# Patient Record
Sex: Female | Born: 1995 | Race: Black or African American | Hispanic: No | Marital: Single | Smoking: Never smoker
Health system: Southern US, Community
[De-identification: ages and names within clinical notes are randomized; demographics above are authoritative.]

## PROBLEM LIST (undated history)

## (undated) ENCOUNTER — Inpatient Hospital Stay (HOSPITAL_COMMUNITY): Payer: Self-pay

## (undated) DIAGNOSIS — F419 Anxiety disorder, unspecified: Secondary | ICD-10-CM

## (undated) DIAGNOSIS — D649 Anemia, unspecified: Secondary | ICD-10-CM

## (undated) DIAGNOSIS — D563 Thalassemia minor: Secondary | ICD-10-CM

## (undated) DIAGNOSIS — J45909 Unspecified asthma, uncomplicated: Secondary | ICD-10-CM

## (undated) DIAGNOSIS — F32A Depression, unspecified: Secondary | ICD-10-CM

## (undated) DIAGNOSIS — R519 Headache, unspecified: Secondary | ICD-10-CM

## (undated) HISTORY — DX: Anxiety disorder, unspecified: F41.9

## (undated) HISTORY — DX: Depression, unspecified: F32.A

## (undated) HISTORY — PX: NO PAST SURGERIES: SHX2092

## (undated) HISTORY — DX: Anemia, unspecified: D64.9

## (undated) HISTORY — DX: Headache, unspecified: R51.9

---
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
## 2016-01-11 ENCOUNTER — Inpatient Hospital Stay (HOSPITAL_COMMUNITY): Admission: AD | Admit: 2016-01-11 | Payer: Medicaid Other | Source: Ambulatory Visit | Admitting: Obstetrics

## 2016-03-12 ENCOUNTER — Other Ambulatory Visit (HOSPITAL_COMMUNITY): Payer: Self-pay | Admitting: Obstetrics

## 2016-03-12 DIAGNOSIS — Z3689 Encounter for other specified antenatal screening: Secondary | ICD-10-CM

## 2016-03-18 ENCOUNTER — Ambulatory Visit (HOSPITAL_COMMUNITY)
Admission: RE | Admit: 2016-03-18 | Discharge: 2016-03-18 | Disposition: A | Discharge status: Home / Self Care | Payer: Medicaid Other | Source: Ambulatory Visit | Attending: Obstetrics | Admitting: Obstetrics

## 2016-03-18 DIAGNOSIS — Z36 Encounter for antenatal screening of mother: Secondary | ICD-10-CM | POA: Insufficient documentation

## 2016-03-18 DIAGNOSIS — Z3A18 18 weeks gestation of pregnancy: Secondary | ICD-10-CM | POA: Insufficient documentation

## 2016-03-18 DIAGNOSIS — Z3689 Encounter for other specified antenatal screening: Secondary | ICD-10-CM

## 2016-03-24 ENCOUNTER — Emergency Department (HOSPITAL_COMMUNITY)
Admission: EM | Admit: 2016-03-24 | Discharge: 2016-03-24 | Disposition: A | Discharge status: Home / Self Care | Payer: Medicaid Other | Attending: Emergency Medicine | Admitting: Emergency Medicine

## 2016-03-24 ENCOUNTER — Encounter (HOSPITAL_COMMUNITY): Payer: Self-pay

## 2016-03-24 DIAGNOSIS — R232 Flushing: Secondary | ICD-10-CM

## 2016-03-24 DIAGNOSIS — J45909 Unspecified asthma, uncomplicated: Secondary | ICD-10-CM | POA: Insufficient documentation

## 2016-03-24 DIAGNOSIS — Z87891 Personal history of nicotine dependence: Secondary | ICD-10-CM | POA: Insufficient documentation

## 2016-03-24 DIAGNOSIS — R21 Rash and other nonspecific skin eruption: Secondary | ICD-10-CM

## 2016-03-24 LAB — URINALYSIS, ROUTINE W REFLEX MICROSCOPIC
BILIRUBIN URINE: NEGATIVE
Glucose, UA: NEGATIVE mg/dL
Hgb urine dipstick: NEGATIVE
Ketones, ur: NEGATIVE mg/dL
LEUKOCYTES UA: NEGATIVE
NITRITE: NEGATIVE
PH: 6.5 (ref 5.0–8.0)
Protein, ur: NEGATIVE mg/dL
SPECIFIC GRAVITY, URINE: 1.025 (ref 1.005–1.030)

## 2016-03-24 LAB — CBC WITH DIFFERENTIAL/PLATELET
BASOS ABS: 0 10*3/uL (ref 0.0–0.1)
BASOS PCT: 0 %
Eosinophils Absolute: 0 10*3/uL (ref 0.0–0.7)
Eosinophils Relative: 1 %
HEMATOCRIT: 33.3 % — AB (ref 36.0–46.0)
HEMOGLOBIN: 10.9 g/dL — AB (ref 12.0–15.0)
Lymphocytes Relative: 18 %
Lymphs Abs: 1 10*3/uL (ref 0.7–4.0)
MCH: 28.7 pg (ref 26.0–34.0)
MCHC: 32.7 g/dL (ref 30.0–36.0)
MCV: 87.6 fL (ref 78.0–100.0)
MONOS PCT: 4 %
Monocytes Absolute: 0.2 10*3/uL (ref 0.1–1.0)
NEUTROS ABS: 4.6 10*3/uL (ref 1.7–7.7)
NEUTROS PCT: 78 %
Platelets: 251 10*3/uL (ref 150–400)
RBC: 3.8 MIL/uL — ABNORMAL LOW (ref 3.87–5.11)
RDW: 12.8 % (ref 11.5–15.5)
WBC: 5.9 10*3/uL (ref 4.0–10.5)

## 2016-03-24 LAB — BASIC METABOLIC PANEL
ANION GAP: 8 (ref 5–15)
BUN: 7 mg/dL (ref 6–20)
CHLORIDE: 104 mmol/L (ref 101–111)
CO2: 21 mmol/L — AB (ref 22–32)
Calcium: 9.6 mg/dL (ref 8.9–10.3)
Creatinine, Ser: 0.58 mg/dL (ref 0.44–1.00)
GFR calc non Af Amer: >60 mL/min (ref >60)
Glucose, Bld: 83 mg/dL (ref 65–99)
POTASSIUM: 3.8 mmol/L (ref 3.5–5.1)
Sodium: 133 mmol/L — ABNORMAL LOW (ref 135–145)

## 2016-03-24 LAB — TSH: TSH: 1.509 u[IU]/mL (ref 0.350–4.500)

## 2016-03-24 NOTE — ED Provider Notes (Signed)
CSN: [id number]     Arrival date & time 03/24/16  1616 History   First MD Initiated Contact with Patient 03/24/16 1632     Chief Complaint  Patient presents with  . Rash  . feeling hot      (Consider location/radiation/quality/duration/timing/severity/associated sxs/prior Treatment) Patient is a 20 y.o. female presenting with rash. The history is provided by the patient.  Rash Location:  Head/neck, face and shoulder/arm Head/neck rash location:  L neck and R neck Facial rash location:  Face, forehead and L cheek Shoulder/arm rash location:  L upper arm Quality: dryness and itchiness   Severity:  Mild Onset quality:  Gradual Duration:  2 weeks Timing:  Constant Progression:  Worsening Chronicity:  Recurrent Context: pregnancy   Context: not animal contact, not chemical exposure, not exposure to similar rash and not sick contacts   Relieved by:  Nothing Ineffective treatments:  Moisturizers Associated symptoms: no abdominal pain, no diarrhea, no fever, no joint pain, no nausea, no shortness of breath, no sore throat, no throat swelling, not vomiting and not wheezing     Past Medical History  Diagnosis Date  . Asthma    Past Surgical History  Procedure Laterality Date  . No past surgeries     Family History  Problem Relation Age of Onset  . Cancer Neg Hx   . Diabetes Neg Hx   . Hypertension Neg Hx    Social History  Substance Use Topics  . Smoking status: Former Games developermoker  . Smokeless tobacco: Never Used  . Alcohol Use: No   OB History    Gravida Para Term Preterm AB TAB SAB Ectopic Multiple Living   1              Review of Systems  Constitutional: Negative for fever.  HENT: Negative for congestion, facial swelling and sore throat.   Respiratory: Negative for shortness of breath and wheezing.   Gastrointestinal: Negative for nausea, vomiting, abdominal pain and diarrhea.  Genitourinary: Negative for dysuria and difficulty urinating.  Musculoskeletal: Negative  for joint swelling and arthralgias.  Skin: Positive for rash.  Psychiatric/Behavioral: Negative for confusion and agitation.      Allergies  Review of patient's allergies indicates no known allergies.  Home Medications   Prior to Admission medications   Medication Sig Start Date End Date Taking? Authorizing Provider  acetaminophen (TYLENOL) 325 MG tablet Take 650 mg by mouth every 6 (six) hours as needed for mild pain.     Historical Provider, MD  PAZEO 0.7 % SOLN Place 1 drop into both eyes 3 (three) times daily. 12/18/15   Historical Provider, MD  Prenatal Vit-Fe Fumarate-FA (PRENATAL MULTIVITAMIN) TABS tablet Take 1 tablet by mouth daily at 12 noon.    Historical Provider, MD  valACYclovir (VALTREX) 1000 MG tablet Take 1,000 mg by mouth daily as needed (outbreak).  08/08/15   Historical Provider, MD   BP 111/71 mmHg  Pulse 81  Temp(Src) 98.2 F (36.8 C) (Oral)  Resp 18  SpO2 99%  LMP 11/09/2015 Physical Exam  Constitutional: She is oriented to person, place, and time. She appears well-developed and well-nourished. No distress.  HENT:  Head: Normocephalic.  Eyes: Conjunctivae are normal.  Neck: Normal range of motion.  Cardiovascular: Normal rate and normal heart sounds.  Exam reveals no gallop and no friction rub.   No murmur heard. Pulmonary/Chest: Effort normal and breath sounds normal.  Musculoskeletal: Normal range of motion. She exhibits no edema.  Neurological: She is alert and  oriented to person, place, and time.  Skin: She is not diaphoretic.  Hypopigmented macules 3-5 mm scattered over the R&L cheeks and forehead. Hyperpigmented patch on the LUE and neck. Dry. No scales or hyperkeratotic lesions.   Psychiatric: She has a normal mood and affect. Her behavior is normal.  Nursing note and vitals reviewed.   ED Course  Procedures (including critical care time) Labs Review Labs Reviewed  CBC WITH DIFFERENTIAL/PLATELET  BASIC METABOLIC PANEL  URINALYSIS, ROUTINE  W REFLEX MICROSCOPIC (NOT AT [facility])    Imaging Review No results found. I have personally reviewed and evaluated these images and lab results as part of my medical decision-making.   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 She is [redacted] weeks pregnant. The lesions on the face appear as small hypopigmented macules over the cheeks and forehead while the neck and arm lesions are hyperpigmented, slightly raised, and 4-5 cm in length. Possible dry skin with local inflammatory reaction from scratching. The patient is otherwise well appearing with normal vitals.  TSH 1.5. CBC, BMP within normal limits. Urinalysis with no evidence of infection.   Patient requests desonide which she states has worked before but is Class C in pregnancy. Patient referred to her dermatologist for further evaluation. Hot flashes likely hormonal 2/2 pregnancy.   Patient discharged in good condition. Seen with Dr. Rhunette CroftNanavati who agreed with this plan.     Levora AngelEric Marcea Rojek, MD 03/25/16 [id number]  Derwood KaplanAnkit Nanavati, MD 03/31/16 [phone]

## 2016-03-24 NOTE — ED Notes (Addendum)
Patient complains of "feeling hot" x 2 months. States that she has also developed rash to face from sweating, no distress. Minimal rash noted to cheeks.patient has seen OB doctor for same complaint and he suggested she needs to hydrate more, states she drinks lots of water and feeling no better. Complains of some leg cramps with same. Also taking her iron as directed

## 2016-04-04 ENCOUNTER — Ambulatory Visit (INDEPENDENT_AMBULATORY_CARE_PROVIDER_SITE_OTHER): Payer: Medicaid Other | Admitting: Obstetrics & Gynecology

## 2016-04-04 VITALS — BP 107/62 | HR 80 | Wt 161.0 lb

## 2016-04-04 DIAGNOSIS — Z3492 Encounter for supervision of normal pregnancy, unspecified, second trimester: Secondary | ICD-10-CM

## 2016-04-04 DIAGNOSIS — Z3490 Encounter for supervision of normal pregnancy, unspecified, unspecified trimester: Secondary | ICD-10-CM

## 2016-04-04 DIAGNOSIS — IMO0002 Reserved for concepts with insufficient information to code with codable children: Secondary | ICD-10-CM

## 2016-04-04 DIAGNOSIS — Z0489 Encounter for examination and observation for other specified reasons: Secondary | ICD-10-CM

## 2016-04-04 DIAGNOSIS — Z6791 Unspecified blood type, Rh negative: Secondary | ICD-10-CM | POA: Insufficient documentation

## 2016-04-04 DIAGNOSIS — O26899 Other specified pregnancy related conditions, unspecified trimester: Secondary | ICD-10-CM

## 2016-04-04 NOTE — Patient Instructions (Signed)
Return to clinic for any scheduled appointments or obstetric concerns, or go to [facility] for evaluation  Second Trimester of Pregnancy The second trimester is from week 13 through week 28, months 4 through 6. The second trimester is often a time when you feel your best. Your body has also adjusted to being pregnant, and you begin to feel better physically. Usually, morning sickness has lessened or quit completely, you may have more energy, and you may have an increase in appetite. The second trimester is also a time when the fetus is growing rapidly. At the end of the sixth month, the fetus is about 9 inches long and weighs about 1 pounds. You will likely begin to feel the baby move (quickening) between 18 and 20 weeks of the pregnancy. BODY CHANGES Your body goes through many changes during pregnancy. The changes vary from woman to woman.   Your weight will continue to increase. You will notice your lower abdomen bulging out.  You may begin to get stretch marks on your hips, abdomen, and breasts.  You may develop headaches that can be relieved by medicines approved by your health care provider.  You may urinate more often because the fetus is pressing on your bladder.  You may develop or continue to have heartburn as a result of your pregnancy.  You may develop constipation because certain hormones are causing the muscles that push waste through your intestines to slow down.  You may develop hemorrhoids or swollen, bulging veins (varicose veins).  You may have back pain because of the weight gain and pregnancy hormones relaxing your joints between the bones in your pelvis and as a result of a shift in weight and the muscles that support your balance.  Your breasts will continue to grow and be tender.  Your gums may bleed and may be sensitive to brushing and flossing.  Dark spots or blotches (chloasma, mask of pregnancy) may develop on your face. This will likely fade after the baby is  born.  A dark line from your belly button to the pubic area (linea nigra) may appear. This will likely fade after the baby is born.  You may have changes in your hair. These can include thickening of your hair, rapid growth, and changes in texture. Some women also have hair loss during or after pregnancy, or hair that feels dry or thin. Your hair will most likely return to normal after your baby is born. WHAT TO EXPECT AT YOUR PRENATAL VISITS During a routine prenatal visit:  You will be weighed to make sure you and the fetus are growing normally.  Your blood pressure will be taken.  Your abdomen will be measured to track your baby's growth.  The fetal heartbeat will be listened to.  Any test results from the previous visit will be discussed. Your health care provider may ask you:  How you are feeling.  If you are feeling the baby move.  If you have had any abnormal symptoms, such as leaking fluid, bleeding, severe headaches, or abdominal cramping.  If you are using any tobacco products, including cigarettes, chewing tobacco, and electronic cigarettes.  If you have any questions. Other tests that may be performed during your second trimester include:  Blood tests that check for:  Low iron levels (anemia).  Gestational diabetes (between 24 and 28 weeks).  Rh antibodies.  Urine tests to check for infections, diabetes, or protein in the urine.  An ultrasound to confirm the proper growth and development of   the baby.  An amniocentesis to check for possible genetic problems.  Fetal screens for spina bifida and Down syndrome.  HIV (human immunodeficiency virus) testing. Routine prenatal testing includes screening for HIV, unless you choose not to have this test. HOME CARE INSTRUCTIONS   Avoid all smoking, herbs, alcohol, and unprescribed drugs. These chemicals affect the formation and growth of the baby.  Do not use any tobacco products, including cigarettes, chewing  tobacco, and electronic cigarettes. If you need help quitting, ask your health care provider. You may receive counseling support and other resources to help you quit.  Follow your health care provider's instructions regarding medicine use. There are medicines that are either safe or unsafe to take during pregnancy.  Exercise only as directed by your health care provider. Experiencing uterine cramps is a good sign to stop exercising.  Continue to eat regular, healthy meals.  Wear a good support bra for breast tenderness.  Do not use hot tubs, steam rooms, or saunas.  Wear your seat belt at all times when driving.  Avoid raw meat, uncooked cheese, cat litter boxes, and soil used by cats. These carry germs that can cause birth defects in the baby.  Take your prenatal vitamins.  Take 1500-2000 mg of calcium daily starting at the 20th week of pregnancy until you deliver your baby.  Try taking a stool softener (if your health care provider approves) if you develop constipation. Eat more high-fiber foods, such as fresh vegetables or fruit and whole grains. Drink plenty of fluids to keep your urine clear or pale yellow.  Take warm sitz baths to soothe any pain or discomfort caused by hemorrhoids. Use hemorrhoid cream if your health care provider approves.  If you develop varicose veins, wear support hose. Elevate your feet for 15 minutes, 3-4 times a day. Limit salt in your diet.  Avoid heavy lifting, wear low heel shoes, and practice good posture.  Rest with your legs elevated if you have leg cramps or low back pain.  Visit your dentist if you have not gone yet during your pregnancy. Use a soft toothbrush to brush your teeth and be gentle when you floss.  A sexual relationship may be continued unless your health care provider directs you otherwise.  Continue to go to all your prenatal visits as directed by your health care provider. SEEK MEDICAL CARE IF:   You have dizziness.  You  have mild pelvic cramps, pelvic pressure, or nagging pain in the abdominal area.  You have persistent nausea, vomiting, or diarrhea.  You have a bad smelling vaginal discharge.  You have pain with urination. SEEK IMMEDIATE MEDICAL CARE IF:   You have a fever.  You are leaking fluid from your vagina.  You have spotting or bleeding from your vagina.  You have severe abdominal cramping or pain.  You have rapid weight gain or loss.  You have shortness of breath with chest pain.  You notice sudden or extreme swelling of your face, hands, ankles, feet, or legs.  You have not felt your baby move in over an hour.  You have severe headaches that do not go away with medicine.  You have vision changes.   This information is not intended to replace advice given to you by your health care provider. Make sure you discuss any questions you have with your health care provider.   Document Released: 09/03/2001 Document Revised: 09/30/2014 Document Reviewed: 11/10/2012 Elsevier Interactive Patient Education 2016 [employer]  

## 2016-04-04 NOTE — Progress Notes (Signed)
Subjective:  Christina HellerSamantha Willis is a 20 y.o. G1P0 at 7337w0d being seen today for ongoing prenatal care.  She is currently monitored for the following issues for this low-risk pregnancy and has Supervision of normal pregnancy and Rh negative, antepartum on her problem list.  Patient reports no complaints.  Contractions: Not present. Vag. Bleeding: None.  Movement: Present. Denies leaking of fluid.   The following portions of the patient's history were reviewed and updated as appropriate: allergies, current medications, past family history, past medical history, past social history, past surgical history and problem list. Problem list updated.  Objective:   Filed Vitals:   04/04/16 1313  BP: 107/62  Pulse: 80  Weight: 161 lb (73.029 kg)    Fetal Status: Fetal Heart Rate (bpm): 143 Fundal Height: 21 cm Movement: Present     General:  Alert, oriented and cooperative. Patient is in no acute distress.  Skin: Skin is warm and dry. No rash noted.   Cardiovascular: Normal heart rate noted  Respiratory: Normal respiratory effort, no problems with respiration noted  Abdomen: Soft, gravid, appropriate for gestational age. Pain/Pressure: Absent     Pelvic:  Cervical exam deferred        Extremities: Normal range of motion.  Edema: None  Mental Status: Normal mood and affect. Normal behavior. Normal judgment and thought content.   Urinalysis: Urine Protein: Negative Urine Glucose: Negative  Assessment and Plan:  Pregnancy: G1P0 at 3737w0d  1. Evaluate anatomy not seen on prior sonogram Inadequate anatomy scan at 18 weeks, rescan ordered - US MFM OB FOLLOW UP; Future  2. Supervision of normal pregnancy, second trimester - AFP, Quad Screen Preterm labor symptoms and general obstetric precautions including but not limited to vaginal bleeding, contractions, leaking of fluid and fetal movement were reviewed in detail with the patient. Please refer to After Visit Summary for other counseling  recommendations.  Return in about 4 weeks (around 05/02/2016) for OB Visit.   Tereso NewcomerUgonna A Kindred Reidinger, MD

## 2016-04-09 LAB — AFP, QUAD SCREEN
DIA Mom Value: 0.67
DIA VALUE (EIA): 136.48 pg/mL
DSR (BY AGE) 1 IN: 1167
DSR (Second Trimester) 1 IN: 7486
Gestational Age: 21 WEEKS
MATERNAL AGE AT EDD: 19.7 a
MSAFP MOM: 0.55
MSAFP: 37.3 ng/mL
MSHCG Mom: 0.98
MSHCG: 20628 m[IU]/mL
Osb Risk: 10000
PDF: 0
T18 (By Age): 1:4548 {titer}
TEST RESULTS AFP: NEGATIVE
UE3 MOM: 0.97
UE3 VALUE: 2.04 ng/mL
WEIGHT: 161 [lb_av]

## 2016-04-11 ENCOUNTER — Encounter: Payer: Self-pay | Admitting: *Deleted

## 2016-04-17 ENCOUNTER — Ambulatory Visit (HOSPITAL_COMMUNITY)
Admission: RE | Admit: 2016-04-17 | Discharge: 2016-04-17 | Disposition: A | Discharge status: Home / Self Care | Payer: Medicaid Other | Source: Ambulatory Visit | Attending: Obstetrics & Gynecology | Admitting: Obstetrics & Gynecology

## 2016-04-17 DIAGNOSIS — IMO0002 Reserved for concepts with insufficient information to code with codable children: Secondary | ICD-10-CM

## 2016-04-17 DIAGNOSIS — Z36 Encounter for antenatal screening of mother: Secondary | ICD-10-CM | POA: Insufficient documentation

## 2016-04-17 DIAGNOSIS — Z0489 Encounter for examination and observation for other specified reasons: Secondary | ICD-10-CM

## 2016-04-17 DIAGNOSIS — Z3A22 22 weeks gestation of pregnancy: Secondary | ICD-10-CM | POA: Diagnosis not present

## 2016-04-30 ENCOUNTER — Ambulatory Visit (INDEPENDENT_AMBULATORY_CARE_PROVIDER_SITE_OTHER): Payer: Medicaid Other | Admitting: Obstetrics & Gynecology

## 2016-04-30 VITALS — BP 124/79 | HR 89 | Wt 175.0 lb

## 2016-04-30 DIAGNOSIS — Z3402 Encounter for supervision of normal first pregnancy, second trimester: Secondary | ICD-10-CM | POA: Diagnosis not present

## 2016-04-30 DIAGNOSIS — Z331 Pregnant state, incidental: Secondary | ICD-10-CM

## 2016-04-30 DIAGNOSIS — Z1389 Encounter for screening for other disorder: Secondary | ICD-10-CM

## 2016-04-30 LAB — POCT URINALYSIS DIPSTICK
Bilirubin, UA: NEGATIVE
Blood, UA: NEGATIVE
KETONES UA: NEGATIVE
NITRITE UA: NEGATIVE
PROTEIN UA: NEGATIVE
Spec Grav, UA: 1.01
Urobilinogen, UA: 0.2
pH, UA: 6

## 2016-04-30 NOTE — Progress Notes (Signed)
Subjective:  Christina HellerSamantha Willis is a 20 y.o. G1P0 at 2331w5d being seen today for ongoing prenatal care.  She is currently monitored for the following issues for this low-risk pregnancy and has Supervision of normal pregnancy and Rh negative, antepartum on her problem list.  Patient reports no complaints.  Contractions: Not present. Vag. Bleeding: None.  Movement: Present. Denies leaking of fluid.   The following portions of the patient's history were reviewed and updated as appropriate: allergies, current medications, past family history, past medical history, past social history, past surgical history and problem list. Problem list updated.  Objective:   Vitals:   04/30/16 1335  BP: 124/79  Pulse: 89  Weight: 175 lb (79.4 kg)    Fetal Status: Fetal Heart Rate (bpm): 138 Fundal Height: 24 cm Movement: Present     General:  Alert, oriented and cooperative. Patient is in no acute distress.  Skin: Skin is warm and dry. No rash noted.   Cardiovascular: Normal heart rate noted  Respiratory: Normal respiratory effort, no problems with respiration noted  Abdomen: Soft, gravid, appropriate for gestational age. Pain/Pressure: Absent     Pelvic:  Cervical exam deferred        Extremities: Normal range of motion.  Edema: None  Mental Status: Normal mood and affect. Normal behavior. Normal judgment and thought content.   Urinalysis: Urine Protein: Negative Urine Glucose: Negative  Assessment and Plan:  Pregnancy: G1P0 at 9831w5d  1. Encounter for supervision of normal first pregnancy in second trimester Preterm labor symptoms and general obstetric precautions including but not limited to vaginal bleeding, contractions, leaking of fluid and fetal movement were reviewed in detail with the patient. Please refer to After Visit Summary for other counseling recommendations.  Return in about 4 weeks (around 05/28/2016) for 2 hr GTT, 3rd trimester labs, TDap, OB Visit (schedule in morning, needs to be  fasting).   Tereso NewcomerUgonna A Zoha Spranger, MD

## 2016-04-30 NOTE — Patient Instructions (Signed)
Return to clinic for any scheduled appointments or obstetric concerns, or go to [facility] for evaluation  Tdap Vaccine (Tetanus, Diphtheria and Pertussis): What You Need to Know 1. Why get vaccinated? Tetanus, diphtheria and pertussis are very serious diseases. Tdap vaccine can protect us from these diseases. And, Tdap vaccine given to pregnant women can protect newborn babies against pertussis. TETANUS (Lockjaw) is rare in the [place] today. It causes painful muscle tightening and stiffness, usually all over the body.  It can lead to tightening of muscles in the head and neck so you can't open your mouth, swallow, or sometimes even breathe. Tetanus kills about 1 out of 10 people who are infected even after receiving the best medical care. DIPHTHERIA is also rare in the [place] today. It can cause a thick coating to form in the back of the throat.  It can lead to breathing problems, heart failure, paralysis, and death. PERTUSSIS (Whooping Cough) causes severe coughing spells, which can cause difficulty breathing, vomiting and disturbed sleep.  It can also lead to weight loss, incontinence, and rib fractures. Up to 2 in 100 adolescents and 5 in 100 adults with pertussis are hospitalized or have complications, which could include pneumonia or death. These diseases are caused by bacteria. Diphtheria and pertussis are spread from person to person through secretions from coughing or sneezing. Tetanus enters the body through cuts, scratches, or wounds. Before vaccines, as many as 200,000 cases of diphtheria, 200,000 cases of pertussis, and hundreds of cases of tetanus, were reported in the [place] each year. Since vaccination began, reports of cases for tetanus and diphtheria have dropped by about 99% and for pertussis by about 80%. 2. Tdap vaccine Tdap vaccine can protect adolescents and adults from tetanus, diphtheria, and pertussis. One dose of Tdap is routinely given at age 11 or 12.  People who did not get Tdap at that age should get it as soon as possible. Tdap is especially important for healthcare professionals and anyone having close contact with a baby younger than 12 months. Pregnant women should get a dose of Tdap during every pregnancy, to protect the newborn from pertussis. Infants are most at risk for severe, life-threatening complications from pertussis. Another vaccine, called Td, protects against tetanus and diphtheria, but not pertussis. A Td booster should be given every 10 years. Tdap may be given as one of these boosters if you have never gotten Tdap before. Tdap may also be given after a severe cut or burn to prevent tetanus infection. Your doctor or the person giving you the vaccine can give you more information. Tdap may safely be given at the same time as other vaccines. 3. Some people should not get this vaccine  A person who has ever had a life-threatening allergic reaction after a previous dose of any diphtheria, tetanus or pertussis containing vaccine, OR has a severe allergy to any part of this vaccine, should not get Tdap vaccine. Tell the person giving the vaccine about any severe allergies.  Anyone who had coma or long repeated seizures within 7 days after a childhood dose of DTP or DTaP, or a previous dose of Tdap, should not get Tdap, unless a cause other than the vaccine was found. They can still get Td.  Talk to your doctor if you:  have seizures or another nervous system problem,  had severe pain or swelling after any vaccine containing diphtheria, tetanus or pertussis,  ever had a condition called Guillain-Barr Syndrome (GBS),  aren't feeling   well on the day the shot is scheduled. 4. Risks With any medicine, including vaccines, there is a chance of side effects. These are usually mild and go away on their own. Serious reactions are also possible but are rare. Most people who get Tdap vaccine do not have any problems with it. Mild  problems following Tdap (Did not interfere with activities)  Pain where the shot was given (about 3 in 4 adolescents or 2 in 3 adults)  Redness or swelling where the shot was given (about 1 person in 5)  Mild fever of at least 100.4F (up to about 1 in 25 adolescents or 1 in 100 adults)  Headache (about 3 or 4 people in 10)  Tiredness (about 1 person in 3 or 4)  Nausea, vomiting, diarrhea, stomach ache (up to 1 in 4 adolescents or 1 in 10 adults)  Chills, sore joints (about 1 person in 10)  Body aches (about 1 person in 3 or 4)  Rash, swollen glands (uncommon) Moderate problems following Tdap (Interfered with activities, but did not require medical attention)  Pain where the shot was given (up to 1 in 5 or 6)  Redness or swelling where the shot was given (up to about 1 in 16 adolescents or 1 in 12 adults)  Fever over 102F (about 1 in 100 adolescents or 1 in 250 adults)  Headache (about 1 in 7 adolescents or 1 in 10 adults)  Nausea, vomiting, diarrhea, stomach ache (up to 1 or 3 people in 100)  Swelling of the entire arm where the shot was given (up to about 1 in 500). Severe problems following Tdap (Unable to perform usual activities; required medical attention)  Swelling, severe pain, bleeding and redness in the arm where the shot was given (rare). Problems that could happen after any vaccine:  People sometimes faint after a medical procedure, including vaccination. Sitting or lying down for about 15 minutes can help prevent fainting, and injuries caused by a fall. Tell your doctor if you feel dizzy, or have vision changes or ringing in the ears.  Some people get severe pain in the shoulder and have difficulty moving the arm where a shot was given. This happens very rarely.  Any medication can cause a severe allergic reaction. Such reactions from a vaccine are very rare, estimated at fewer than 1 in a million doses, and would happen within a few minutes to a few hours  after the vaccination. As with any medicine, there is a very remote chance of a vaccine causing a serious injury or death. The safety of vaccines is always being monitored. For more information, visit: www.cdc.gov/vaccinesafety/ 5. What if there is a serious problem? What should I look for?  Look for anything that concerns you, such as signs of a severe allergic reaction, very high fever, or unusual behavior.  Signs of a severe allergic reaction can include hives, swelling of the face and throat, difficulty breathing, a fast heartbeat, dizziness, and weakness. These would usually start a few minutes to a few hours after the vaccination. What should I do?  If you think it is a severe allergic reaction or other emergency that can't wait, call 9-1-1 or get the person to the nearest hospital. Otherwise, call your doctor.  Afterward, the reaction should be reported to the Vaccine Adverse Event Reporting System (VAERS). Your doctor might file this report, or you can do it yourself through the VAERS web site at www.vaers.hhs.gov, or by calling 1-800-822-7967. VAERS does not   give medical advice.  6. The National Vaccine Injury Compensation Program The National Vaccine Injury Compensation Program (VICP) is a federal program that was created to compensate people who may have been injured by certain vaccines. Persons who believe they may have been injured by a vaccine can learn about the program and about filing a claim by calling 1-800-338-2382 or visiting the VICP website at www.hrsa.gov/vaccinecompensation. There is a time limit to file a claim for compensation. 7. How can I learn more?  Ask your doctor. He or she can give you the vaccine package insert or suggest other sources of information.  Call your local or state health department.  Contact the Centers for Disease Control and Prevention (CDC):  Call 1-800-232-4636 (1-800-CDC-INFO) or  Visit CDC's website at www.cdc.gov/vaccines CDC Tdap  Vaccine VIS (11/16/13)   This information is not intended to replace advice given to you by your health care provider. Make sure you discuss any questions you have with your health care provider.   Document Released: 03/10/2012 Document Revised: 09/30/2014 Document Reviewed: 12/22/2013 Elsevier Interactive Patient Education 2016 Elsevier Inc.  

## 2016-05-14 ENCOUNTER — Telehealth: Payer: Self-pay | Admitting: *Deleted

## 2016-05-14 ENCOUNTER — Other Ambulatory Visit: Payer: Self-pay | Admitting: Obstetrics and Gynecology

## 2016-05-14 MED ORDER — PRENATAL VITAMINS 0.8 MG PO TABS: 1.0000 | ORAL_TABLET | Freq: Every day | ORAL | 12 refills | Status: DC

## 2016-05-14 NOTE — Telephone Encounter (Signed)
Patient states her provider forgot her prescriptions. She wants a pill form instead of chewable for her prenatal.  She also is requesting a refill of the Tylenol #3 that her previous provider had given her for pain. [facility] is [facility] the [facility] location. I told her the prenatal vitamins would not be a problem, but her provider would have to decide about refilling her pain medication.

## 2016-05-21 ENCOUNTER — Telehealth: Payer: Self-pay | Admitting: *Deleted

## 2016-05-21 NOTE — Telephone Encounter (Signed)
Patient notified of Rx at pharmacy and told she would be evaluated at her next appointment.

## 2016-05-21 NOTE — Telephone Encounter (Signed)
-----   Message from Catalina AntiguaPeggy Constant, MD sent at 05/14/2016  3:24 PM EDT ----- Good afternoon,  Rx prenatal vitamin tablet e-prescribed. No refill on pain prescription provided. She will have to be seen and evaluated at her next appointment  Thanks  Peggy

## 2016-05-28 ENCOUNTER — Ambulatory Visit (INDEPENDENT_AMBULATORY_CARE_PROVIDER_SITE_OTHER): Payer: Medicaid Other | Admitting: Obstetrics & Gynecology

## 2016-05-28 ENCOUNTER — Other Ambulatory Visit: Payer: Medicaid Other

## 2016-05-28 VITALS — BP 109/70 | HR 94 | Temp 98.1°F | Wt 173.8 lb

## 2016-05-28 DIAGNOSIS — Z3493 Encounter for supervision of normal pregnancy, unspecified, third trimester: Secondary | ICD-10-CM

## 2016-05-28 DIAGNOSIS — O360130 Maternal care for anti-D [Rh] antibodies, third trimester, not applicable or unspecified: Secondary | ICD-10-CM

## 2016-05-28 DIAGNOSIS — Z23 Encounter for immunization: Secondary | ICD-10-CM

## 2016-05-28 MED ORDER — RHO D IMMUNE GLOBULIN 1500 UNITS IM SOSY
1500.0000 [IU] | PREFILLED_SYRINGE | Freq: Once | INTRAMUSCULAR | Status: AC
  Administered 2016-05-28: 1500 [IU] via INTRAMUSCULAR

## 2016-05-28 NOTE — Progress Notes (Signed)
   PRENATAL VISIT NOTE  Subjective:  Christina Willis is a 20 y.o. G1P0 at 323w5d being seen today for ongoing prenatal care.  She is currently monitored for the following issues for this low-risk pregnancy and has Supervision of normal pregnancy and Rh negative, antepartum on her problem list.  Patient reports no complaints.  Contractions: Not present. Vag. Bleeding: None.  Movement: Present. Denies leaking of fluid.   The following portions of the patient's history were reviewed and updated as appropriate: allergies, current medications, past family history, past medical history, past social history, past surgical history and problem list. Problem list updated.  Objective:   Vitals:   05/28/16 0948  BP: 109/70  Pulse: 94  Temp: 98.1 F (36.7 C)  Weight: 173 lb 12.8 oz (78.8 kg)    Fetal Status: Fetal Heart Rate (bpm): 140 Fundal Height: 28 cm Movement: Present     General:  Alert, oriented and cooperative. Patient is in no acute distress.  Skin: Skin is warm and dry. No rash noted.   Cardiovascular: Normal heart rate noted  Respiratory: Normal respiratory effort, no problems with respiration noted  Abdomen: Soft, gravid, appropriate for gestational age. Pain/Pressure: Absent     Pelvic:  Cervical exam deferred        Extremities: Normal range of motion.  Edema: None  Mental Status: Normal mood and affect. Normal behavior. Normal judgment and thought content.   Urinalysis: Urine Protein: Negative Urine Glucose: Negative  Assessment and Plan:  Pregnancy: G1P0 at 473w5d  1. Rh negative, antepartum, third trimester, not applicable or unspecified fetus Rhogam today - Rho D Immune Globulin SOSY 1,500 Units; Inject 1,500 Units into the muscle once.  2. Supervision of normal pregnancy, third trimester Third trimester labs and Tdap today - CBC - HIV antibody - RPR - Glucose Tolerance, 2 Hours w/1 Hour - Tdap vaccine Preterm labor symptoms and general obstetric precautions  including but not limited to vaginal bleeding, contractions, leaking of fluid and fetal movement were reviewed in detail with the patient. Please refer to After Visit Summary for other counseling recommendations.  Return in about 2 weeks (around 06/11/2016) for OB Visit.  Tereso NewcomerUgonna A Arick Mareno, MD

## 2016-05-28 NOTE — Patient Instructions (Signed)

## 2016-05-29 LAB — GLUCOSE TOLERANCE, 2 HOURS W/ 1HR
GLUCOSE, 1 HOUR: 130 mg/dL (ref 65–179)
GLUCOSE, FASTING: 79 mg/dL (ref 65–91)

## 2016-05-30 LAB — HIV ANTIBODY (ROUTINE TESTING W REFLEX): HIV Screen 4th Generation wRfx: NONREACTIVE

## 2016-05-30 LAB — CBC
HEMATOCRIT: 34.1 % (ref 34.0–46.6)
HEMOGLOBIN: 10.9 g/dL — AB (ref 11.1–15.9)
MCH: 28 pg (ref 26.6–33.0)
MCHC: 32 g/dL (ref 31.5–35.7)
MCV: 88 fL (ref 79–97)
PLATELETS: 255 10*3/uL (ref 150–379)
RBC: 3.89 x10E6/uL (ref 3.77–5.28)
RDW: 14.5 % (ref 12.3–15.4)
WBC: 7.2 10*3/uL (ref 3.4–10.8)

## 2016-05-30 LAB — RPR: RPR Ser Ql: NONREACTIVE

## 2016-06-12 ENCOUNTER — Ambulatory Visit (INDEPENDENT_AMBULATORY_CARE_PROVIDER_SITE_OTHER): Payer: Medicaid Other | Admitting: Obstetrics & Gynecology

## 2016-06-12 VITALS — BP 109/74 | HR 110 | Temp 97.8°F | Wt 178.8 lb

## 2016-06-12 DIAGNOSIS — Z3493 Encounter for supervision of normal pregnancy, unspecified, third trimester: Secondary | ICD-10-CM

## 2016-06-12 NOTE — Progress Notes (Signed)
   PRENATAL VISIT NOTE  Subjective:  Christina Willis is a 20 y.o. G1P0 at 1252w6d being seen today for ongoing prenatal care.  She is currently monitored for the following issues for this low-risk pregnancy and has Supervision of normal pregnancy and Rh negative, antepartum on her problem list.  Patient reports no complaints.  Contractions: Not present. Vag. Bleeding: None.  Movement: Present. Denies leaking of fluid.   The following portions of the patient's history were reviewed and updated as appropriate: allergies, current medications, past family history, past medical history, past social history, past surgical history and problem list. Problem list updated.  Objective:   Vitals:   06/12/16 1417  BP: 109/74  Pulse: (!) 110  Temp: 97.8 F (36.6 C)  Weight: 178 lb 12.8 oz (81.1 kg)    Fetal Status: Fetal Heart Rate (bpm): 155 Fundal Height: 30 cm Movement: Present     General:  Alert, oriented and cooperative. Patient is in no acute distress.  Skin: Skin is warm and dry. No rash noted.   Cardiovascular: Normal heart rate noted  Respiratory: Normal respiratory effort, no problems with respiration noted  Abdomen: Soft, gravid, appropriate for gestational age. Pain/Pressure: Present     Pelvic:  Cervical exam deferred        Extremities: Normal range of motion.  Edema: None  Mental Status: Normal mood and affect. Normal behavior. Normal judgment and thought content.   Urinalysis: Urine Protein: Negative Urine Glucose: Negative  Assessment and Plan:  Pregnancy: G1P0 at 2252w6d  1. Supervision of normal pregnancy, third trimester Preterm labor symptoms and general obstetric precautions including but not limited to vaginal bleeding, contractions, leaking of fluid and fetal movement were reviewed in detail with the patient. Please refer to After Visit Summary for other counseling recommendations.  Return in about 2 weeks (around 06/26/2016) for OB Visit.  Tereso NewcomerUgonna A Crestina Strike, MD

## 2016-06-12 NOTE — Patient Instructions (Signed)
Return to clinic for any scheduled appointments or obstetric concerns, or go to [facility] for evaluation  

## 2016-06-22 ENCOUNTER — Emergency Department (HOSPITAL_COMMUNITY)
Admission: EM | Admit: 2016-06-22 | Discharge: 2016-06-22 | Disposition: A | Discharge status: Home / Self Care | Payer: Medicaid Other | Attending: Emergency Medicine | Admitting: Emergency Medicine

## 2016-06-22 ENCOUNTER — Encounter (HOSPITAL_COMMUNITY): Payer: Self-pay | Admitting: Emergency Medicine

## 2016-06-22 DIAGNOSIS — Z3A32 32 weeks gestation of pregnancy: Secondary | ICD-10-CM | POA: Diagnosis not present

## 2016-06-22 DIAGNOSIS — Z87891 Personal history of nicotine dependence: Secondary | ICD-10-CM | POA: Insufficient documentation

## 2016-06-22 DIAGNOSIS — O2343 Unspecified infection of urinary tract in pregnancy, third trimester: Secondary | ICD-10-CM | POA: Insufficient documentation

## 2016-06-22 DIAGNOSIS — J45909 Unspecified asthma, uncomplicated: Secondary | ICD-10-CM | POA: Insufficient documentation

## 2016-06-22 DIAGNOSIS — O26893 Other specified pregnancy related conditions, third trimester: Secondary | ICD-10-CM | POA: Diagnosis present

## 2016-06-22 DIAGNOSIS — N390 Urinary tract infection, site not specified: Secondary | ICD-10-CM

## 2016-06-22 DIAGNOSIS — N949 Unspecified condition associated with female genital organs and menstrual cycle: Secondary | ICD-10-CM

## 2016-06-22 LAB — CBC WITH DIFFERENTIAL/PLATELET
BASOS ABS: 0 10*3/uL (ref 0.0–0.1)
Basophils Relative: 0 %
Eosinophils Absolute: 0.1 10*3/uL (ref 0.0–0.7)
Eosinophils Relative: 1 %
HEMATOCRIT: 32.3 % — AB (ref 36.0–46.0)
HEMOGLOBIN: 9.8 g/dL — AB (ref 12.0–15.0)
LYMPHS PCT: 22 %
Lymphs Abs: 1.6 10*3/uL (ref 0.7–4.0)
MCH: 27.2 pg (ref 26.0–34.0)
MCHC: 30.3 g/dL (ref 30.0–36.0)
MCV: 89.7 fL (ref 78.0–100.0)
Monocytes Absolute: 0.4 10*3/uL (ref 0.1–1.0)
Monocytes Relative: 5 %
NEUTROS ABS: 5.2 10*3/uL (ref 1.7–7.7)
NEUTROS PCT: 72 %
Platelets: 196 10*3/uL (ref 150–400)
RBC: 3.6 MIL/uL — AB (ref 3.87–5.11)
RDW: 13.9 % (ref 11.5–15.5)
WBC: 7.2 10*3/uL (ref 4.0–10.5)

## 2016-06-22 LAB — URINE MICROSCOPIC-ADD ON: RBC / HPF: NONE SEEN RBC/hpf (ref 0–5)

## 2016-06-22 LAB — URINALYSIS, ROUTINE W REFLEX MICROSCOPIC
Bilirubin Urine: NEGATIVE
Glucose, UA: NEGATIVE mg/dL
Hgb urine dipstick: NEGATIVE
KETONES UR: NEGATIVE mg/dL
NITRITE: NEGATIVE
Protein, ur: NEGATIVE mg/dL
Specific Gravity, Urine: 1.019 (ref 1.005–1.030)
pH: 7 (ref 5.0–8.0)

## 2016-06-22 LAB — BASIC METABOLIC PANEL
ANION GAP: 8 (ref 5–15)
BUN: <5 mg/dL — ABNORMAL LOW (ref 6–20)
CALCIUM: 8.2 mg/dL — AB (ref 8.9–10.3)
CHLORIDE: 103 mmol/L (ref 101–111)
CO2: 20 mmol/L — AB (ref 22–32)
Creatinine, Ser: 0.56 mg/dL (ref 0.44–1.00)
GFR calc non Af Amer: >60 mL/min (ref >60)
GLUCOSE: 74 mg/dL (ref 65–99)
POTASSIUM: 3.7 mmol/L (ref 3.5–5.1)
Sodium: 131 mmol/L — ABNORMAL LOW (ref 135–145)

## 2016-06-22 MED ORDER — CEPHALEXIN 500 MG PO CAPS: 500.0000 mg | ORAL_CAPSULE | Freq: Two times a day (BID) | ORAL | 0 refills | Status: AC

## 2016-06-22 MED ORDER — CEPHALEXIN 250 MG PO CAPS
500.0000 mg | ORAL_CAPSULE | Freq: Once | ORAL | Status: AC
  Administered 2016-06-22: 500 mg via ORAL
  Filled 2016-06-22: qty 2

## 2016-06-22 MED ORDER — TERBUTALINE SULFATE 1 MG/ML IJ SOLN
0.2500 mg | Freq: Once | INTRAMUSCULAR | Status: AC
  Administered 2016-06-22: 0.25 mg via SUBCUTANEOUS

## 2016-06-22 MED ORDER — LACTATED RINGERS IV BOLUS (SEPSIS)
1000.0000 mL | Freq: Once | INTRAVENOUS | Status: AC
  Administered 2016-06-22: 1000 mL via INTRAVENOUS

## 2016-06-22 MED ORDER — SODIUM CHLORIDE 0.9 % IV BOLUS (SEPSIS): 1000.0000 mL | Freq: Once | INTRAVENOUS | Status: DC

## 2016-06-22 NOTE — ED Provider Notes (Signed)
[facility]-EMERGENCY DEPT Provider Note   CSN: [id number] Arrival date & time: 06/22/16  1400     History   Chief Complaint Chief Complaint  Patient presents with  . pregnant, abdominal pain    HPI Christina Willis is a 20 y.o. female.  HPI Patient presents with two-week history of increasing abdominal pain on the right lower abdomen.  Patient [redacted] weeks pregnant.  This is her first pregnancy.  Denies any fever or chills.  Has had some clear discharge also. Past Medical History:  Diagnosis Date  . Asthma     Patient Active Problem List   Diagnosis Date Noted  . Supervision of normal pregnancy 04/04/2016  . Rh negative, antepartum 04/04/2016    Past Surgical History:  Procedure Laterality Date  . NO PAST SURGERIES      OB History    Gravida Para Term Preterm AB Living   1             SAB TAB Ectopic Multiple Live Births                   Home Medications    Prior to Admission medications   Medication Sig Start Date End Date Taking? Authorizing Provider  acetaminophen (TYLENOL) 325 MG tablet Take 650 mg by mouth every 6 (six) hours as needed for mild pain. Reported on 04/04/2016    Historical Provider, MD  acetaminophen-codeine (TYLENOL #3) 300-30 MG tablet Take 1 tablet by mouth every 6 (six) hours as needed for moderate pain. Reported on 04/04/2016    Historical Provider, MD  acyclovir (ZOVIRAX) 400 MG tablet Take 400 mg by mouth See admin instructions. Reported on 04/04/2016 12/18/15   Historical Provider, MD  albuterol (PROAIR HFA) 108 (90 Base) MCG/ACT inhaler Inhale 2 puffs into the lungs every 4 (four) hours as needed for wheezing or shortness of breath (asthma). Reported on 04/04/2016    Historical Provider, MD  cephALEXin (KEFLEX) 500 MG capsule Take 1 capsule (500 mg total) by mouth 2 (two) times daily. 06/22/16 06/29/16  Canary Brim Tegeler, MD  Olopatadine HCl (PAZEO) 0.7 % SOLN Place 1 drop into both eyes 2 (two) times daily as needed (itching/ seasonal  allergies). Reported on 04/04/2016    Historical Provider, MD  Prenatal Multivit-Min-Fe-FA (PRENATAL VITAMINS) 0.8 MG tablet Take 1 tablet by mouth daily. 05/14/16   Catalina Antigua, MD  Prenatal Vit-Fe Fumarate-FA (PRENATAL MULTIVITAMIN) TABS tablet Take 1 tablet by mouth daily.     Historical Provider, MD    Family History Family History  Problem Relation Age of Onset  . Cancer Neg Hx   . Diabetes Neg Hx   . Hypertension Neg Hx     Social History Social History  Substance Use Topics  . Smoking status: Former Games developer  . Smokeless tobacco: Never Used  . Alcohol use No     Allergies   Review of patient's allergies indicates no known allergies.   Review of Systems Review of Systems  All other systems reviewed and are negative Physical Exam Updated Vital Signs BP 137/77   Pulse 104   Temp 98.9 F (37.2 C) (Oral)   Resp 18   LMP 11/09/2015   SpO2 100%   Physical Exam Physical Exam  Nursing note and vitals reviewed. Constitutional: She is oriented to person, place, and time. She appears well-developed and well-nourished. No distress.  HENT:  Head: Normocephalic and atraumatic.  Eyes: Pupils are equal, round, and reactive to light.  Neck: Normal range of motion.  Cardiovascular: Normal rate and intact distal pulses.   Pulmonary/Chest: No respiratory distress.  Abdominal: Normal appearance.  Gravid uterus.  Mild tenderness in right lower quadrant with no rebound or guarding..  Musculoskeletal: Normal range of motion.  Neurological: She is alert and oriented to person, place, and time. No cranial nerve deficit.  Skin: Skin is warm and dry. No rash noted.  Psychiatric: She has a normal mood and affect. Her behavior is normal.    ED Treatments / Results  Labs (all labs ordered are listed, but only abnormal results are displayed) Labs Reviewed  CBC WITH DIFFERENTIAL/PLATELET - Abnormal; Notable for the following:       Result Value   RBC 3.60 (*)    Hemoglobin 9.8 (*)     HCT 32.3 (*)    All other components within normal limits  BASIC METABOLIC PANEL - Abnormal; Notable for the following:    Sodium 131 (*)    CO2 20 (*)    BUN <5 (*)    Calcium 8.2 (*)    All other components within normal limits  URINALYSIS, ROUTINE W REFLEX MICROSCOPIC (NOT AT [facility]) - Abnormal; Notable for the following:    Leukocytes, UA SMALL (*)    All other components within normal limits  URINE MICROSCOPIC-ADD ON - Abnormal; Notable for the following:    Squamous Epithelial / LPF 0-5 (*)    Bacteria, UA RARE (*)    All other components within normal limits    EKG  EKG Interpretation None       Radiology No results found.  Procedures Procedures (including critical care time)  Medications Ordered in ED Medications  lactated ringers bolus 1,000 mL (0 mLs Intravenous Stopped 06/22/16 1554)  terbutaline (BRETHINE) injection 0.25 mg (0.25 mg Subcutaneous Given 06/22/16 1653)  cephALEXin (KEFLEX) capsule 500 mg (500 mg Oral Given 06/22/16 1809)     Initial Impression / Assessment and Plan / ED Course  I have reviewed the triage vital signs and the nursing notes.  Pertinent labs & imaging results that were available during my care of the patient were reviewed by me and considered in my medical decision making (see chart for details).  Clinical Course    Rapid response nurse from OB came to evaluate the patient.  Final Clinical Impressions(s) / ED Diagnoses   Final diagnoses:  Round ligament pain  UTI (lower urinary tract infection)    New Prescriptions Discharge Medication List as of 06/22/2016  6:02 PM    START taking these medications   Details  cephALEXin (KEFLEX) 500 MG capsule Take 1 capsule (500 mg total) by mouth 2 (two) times daily., Starting Sat 06/22/2016, Until Sat 06/29/2016, Print         Christina Nayobert Tyheem Boughner, MD 06/23/16 [phone]

## 2016-06-22 NOTE — ED Triage Notes (Signed)
[redacted] weeks pregnant; pain on right side of abdomen for 2 weeks; has told OB-GYN ([facility] It has gotten worse over the past week. Feeling fetal movement. States having clear and yellow vaginal discharge for 2 weeks also.

## 2016-06-22 NOTE — Progress Notes (Signed)
S: G1  @ 32.2 wks in with right groin pain for two weeks. Pain is intermittant. O: VSS,FHR pattern reassuring, mild irritability type pattern. SVE ft/th/high/post.  A: uterine I\rritability @ 32.2 wks P:S.Q. Turb, urine pending. Pt report to Dr. Penne LashLeggett. If irritability settles down will d/c home.

## 2016-06-22 NOTE — Progress Notes (Signed)
Dr. Penne LashLeggett called back. Says Zerita Boersarlene Lawson CNM will come over and see pt.

## 2016-06-22 NOTE — Progress Notes (Signed)
Terbutaline .25mg  SQ given in left arm as ordered by Zerita Boersarlene Lawson CNM

## 2016-06-22 NOTE — ED Notes (Signed)
[facility] She is on her way.

## 2016-06-22 NOTE — Progress Notes (Signed)
Spoke with Dr. Rollen SoxLegett. Pt is a G1P0 at 32 2/[redacted] weeks gestation with c/o rt groin pain. No vaginal bleeding or leaking of fluid. FHR is a category 1 with 1 uc with some ui noted. Pt has had a liter of fluid. CBC with diff , CMP has been drawn. U/A will be sent. Pt does not appear to be in any disress. Dr. Penne LashLeggett wants me to check her cervix and if it is closed, pt will be OB cleared.

## 2016-06-22 NOTE — ED Notes (Signed)
OB rapid response RN at bedside. 

## 2016-06-22 NOTE — Progress Notes (Signed)
Pt is to be OB cleared. ED MD can treat pt's urine if necessary.

## 2016-06-22 NOTE — ED Provider Notes (Signed)
4:30 PM  Care assumed from Dr. Radford PaxBeaton.   Patient is a [age] old female who is [redacted] weeks pregnant who presents with 2 weeks of right lower quadrant pain. Workup was initiated with laboratory testing. Next  At time of signout, patient is awaiting assessment by OB/GYN team. Anticipate following up on their recommendations for further management.  Urinalysis showed bacteria and leukocyte esterase. Patient we treated for UTI. Patient given dose of Keflex and prescription for Keflex. Patient will follow up with OB/GYN team in PCP.  Patient discharged in good condition with no further discomfort and understanding of return precautions.   Canary Brimhristopher J Dustee Bottenfield, MD 06/23/16 [phone]

## 2016-06-22 NOTE — Discharge Instructions (Signed)
Go the the Maternity Admissions Unit at [facility] for further problems

## 2016-06-22 NOTE — Progress Notes (Signed)
Spoke with Dr. Penne LashLeggett. Notified of SVE. Wants pt transferred to MAU for further monitoring.

## 2016-06-22 NOTE — Progress Notes (Addendum)
Pt is a G1P0 at 32 2/[redacted] weeks gestation with c/o pain in her rt groin area. Denies vaginal bleeding or leaking of fluid. Denies any problems with this pregnancy. No burning with urination. Good fetal movement. [facility]. This note was entered at 1435.

## 2016-06-26 ENCOUNTER — Ambulatory Visit (INDEPENDENT_AMBULATORY_CARE_PROVIDER_SITE_OTHER): Payer: Medicaid Other | Admitting: Obstetrics & Gynecology

## 2016-06-26 VITALS — BP 113/75 | HR 103 | Temp 98.7°F | Wt 180.2 lb

## 2016-06-26 DIAGNOSIS — Z3403 Encounter for supervision of normal first pregnancy, third trimester: Secondary | ICD-10-CM

## 2016-06-26 NOTE — Patient Instructions (Signed)

## 2016-06-26 NOTE — Progress Notes (Signed)
Patient is in office for routine visit, patient says she has no concerns today.

## 2016-06-26 NOTE — Progress Notes (Signed)
   PRENATAL VISIT NOTE  Subjective:  Christina Willis is a 20 y.o. G1P0 at 4576w6d being seen today for ongoing prenatal care.  She is currently monitored for the following issues for this low-risk pregnancy and has Supervision of normal pregnancy and Rh negative, antepartum on her problem list.  Patient reports standing at work bothers her and wants reduced hours.  Contractions: Not present. Vag. Bleeding: None.  Movement: Present. Denies leaking of fluid.   The following portions of the patient's history were reviewed and updated as appropriate: allergies, current medications, past family history, past medical history, past social history, past surgical history and problem list. Problem list updated.  Objective:   Vitals:   06/26/16 1556  BP: 113/75  Pulse: (!) 103  Temp: 98.7 F (37.1 C)  Weight: 180 lb 3.2 oz (81.7 kg)    Fetal Status: Fetal Heart Rate (bpm): 134 Fundal Height: 32 cm Movement: Present     General:  Alert, oriented and cooperative. Patient is in no acute distress.  Skin: Skin is warm and dry. No rash noted.   Cardiovascular: Normal heart rate noted  Respiratory: Normal respiratory effort, no problems with respiration noted  Abdomen: Soft, gravid, appropriate for gestational age. Pain/Pressure: Absent     Pelvic:  Cervical exam deferred        Extremities: Normal range of motion.  Edema: None  Mental Status: Normal mood and affect. Normal behavior. Normal judgment and thought content.   Urinalysis:      Assessment and Plan:  Pregnancy: G1P0 at 5876w6d  1. Encounter for supervision of normal first pregnancy in third trimester Discomforts of pregnancy  Preterm labor symptoms and general obstetric precautions including but not limited to vaginal bleeding, contractions, leaking of fluid and fetal movement were reviewed in detail with the patient. Please refer to After Visit Summary for other counseling recommendations.  Return in about 2 weeks (around  07/10/2016). Note given stating she requests reduced work hours  Adam PhenixJames G Edric Fetterman, MD

## 2016-07-11 ENCOUNTER — Encounter: Payer: Medicaid Other | Admitting: Obstetrics & Gynecology

## 2016-07-19 ENCOUNTER — Encounter (HOSPITAL_COMMUNITY): Payer: Self-pay | Admitting: *Deleted

## 2016-07-19 ENCOUNTER — Inpatient Hospital Stay (HOSPITAL_COMMUNITY)
Admission: AD | Admit: 2016-07-19 | Discharge: 2016-07-20 | Disposition: A | Discharge status: Home / Self Care | Payer: Medicaid Other | Source: Ambulatory Visit | Attending: Obstetrics and Gynecology | Admitting: Obstetrics and Gynecology

## 2016-07-19 DIAGNOSIS — Z3A36 36 weeks gestation of pregnancy: Secondary | ICD-10-CM | POA: Insufficient documentation

## 2016-07-19 DIAGNOSIS — O4703 False labor before 37 completed weeks of gestation, third trimester: Secondary | ICD-10-CM | POA: Diagnosis not present

## 2016-07-19 NOTE — MAU Note (Signed)
Pt reports increased pelvic pressure and sharp cramping pains that come and go  That started around 10pm. Denies any vag bleeding or leaking. reports some mucusy discharge. Good fetal  Movement felt

## 2016-07-20 DIAGNOSIS — O4703 False labor before 37 completed weeks of gestation, third trimester: Secondary | ICD-10-CM | POA: Diagnosis not present

## 2016-07-20 LAB — URINALYSIS, ROUTINE W REFLEX MICROSCOPIC
BILIRUBIN URINE: NEGATIVE
Glucose, UA: NEGATIVE mg/dL
KETONES UR: NEGATIVE mg/dL
NITRITE: NEGATIVE
PH: 7 (ref 5.0–8.0)
PROTEIN: NEGATIVE mg/dL
Specific Gravity, Urine: 1.01 (ref 1.005–1.030)

## 2016-07-20 LAB — URINE MICROSCOPIC-ADD ON

## 2016-07-23 ENCOUNTER — Ambulatory Visit (INDEPENDENT_AMBULATORY_CARE_PROVIDER_SITE_OTHER): Payer: Medicaid Other | Admitting: Obstetrics & Gynecology

## 2016-07-23 DIAGNOSIS — Z3403 Encounter for supervision of normal first pregnancy, third trimester: Secondary | ICD-10-CM

## 2016-07-23 NOTE — Progress Notes (Signed)
Patient is in the office, reports good fetal movement. 

## 2016-07-23 NOTE — Progress Notes (Signed)
   PRENATAL VISIT NOTE  Subjective:  Christina Willis is a 20 y.o. G1P0 at 562w5d being seen today for ongoing prenatal care.  She is currently monitored for the following issues for this low-risk pregnancy and has Supervision of normal pregnancy and Rh negative, antepartum on her problem list.  Patient reports occasional contractions.  Contractions: Irregular. Vag. Bleeding: None.  Movement: Present. Denies leaking of fluid.   The following portions of the patient's history were reviewed and updated as appropriate: allergies, current medications, past family history, past medical history, past social history, past surgical history and problem list. Problem list updated.  Objective:   Vitals:   07/23/16 1524  BP: 126/82  Pulse: 88  Temp: 98.7 F (37.1 C)  Weight: 186 lb 12.8 oz (84.7 kg)    Fetal Status: Fetal Heart Rate (bpm): 150   Movement: Present     General:  Alert, oriented and cooperative. Patient is in no acute distress.  Skin: Skin is warm and dry. No rash noted.   Cardiovascular: Normal heart rate noted  Respiratory: Normal respiratory effort, no problems with respiration noted  Abdomen: Soft, gravid, appropriate for gestational age. Pain/Pressure: Absent     Pelvic:  Cervical exam performed        Extremities: Normal range of motion.  Edema: None  Mental Status: Normal mood and affect. Normal behavior. Normal judgment and thought content.   Assessment and Plan:  Pregnancy: G1P0 at 4062w5d  1. Encounter for supervision of normal first pregnancy in third trimester GBS and GC CT today  Preterm labor symptoms and general obstetric precautions including but not limited to vaginal bleeding, contractions, leaking of fluid and fetal movement were reviewed in detail with the patient. Please refer to After Visit Summary for other counseling recommendations.  1 week Adam PhenixJames G Arnold, MD

## 2016-07-25 LAB — STREP GP B NAA: STREP GROUP B AG: POSITIVE — AB

## 2016-07-30 ENCOUNTER — Encounter: Payer: Medicaid Other | Admitting: Certified Nurse Midwife

## 2016-10-20 ENCOUNTER — Encounter (HOSPITAL_COMMUNITY): Payer: Self-pay

## 2017-10-21 IMAGING — US US OB TRANSVAGINAL
1 series · 13 of 28 positions shown · non-contrast
Comparison: None.

CLINICAL DATA: Pelvic pain in urinary frequency. Positive pregnancy
test with quantitative beta HCG 10/12/1947. Estimated gestational
age by LMP is 5 weeks 0 days.

EXAM:
OBSTETRIC <14 WK US AND TRANSVAGINAL OB US
TECHNIQUE: Both transabdominal and transvaginal ultrasound examinations were
performed for complete evaluation of the gestation as well as the
maternal uterus, adnexal regions, and pelvic cul-de-sac.
Transvaginal technique was performed to assess early pregnancy.

[Series 1: us ob transvaginal · 0.20mm/px · 94 acquisitions, 13 frames shown]
[im 4/94]
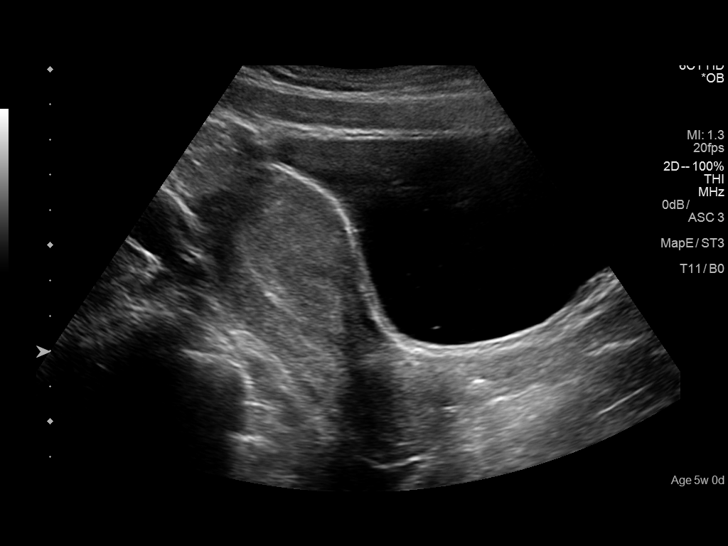
[im 11/94]
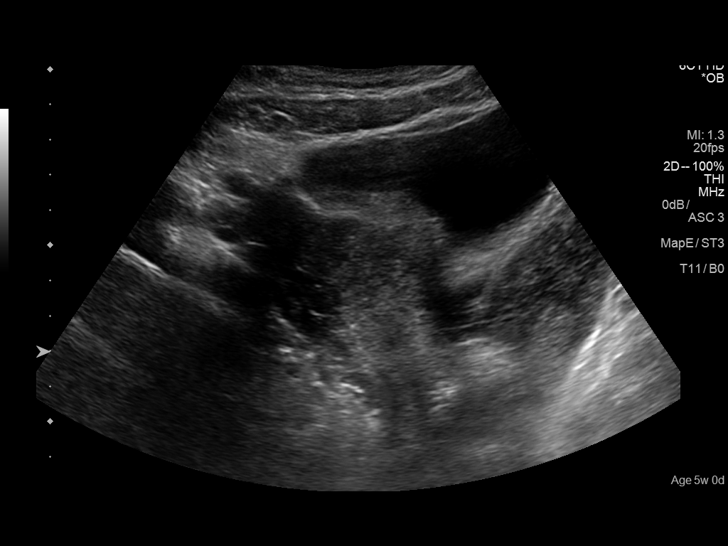
[im 18/94]
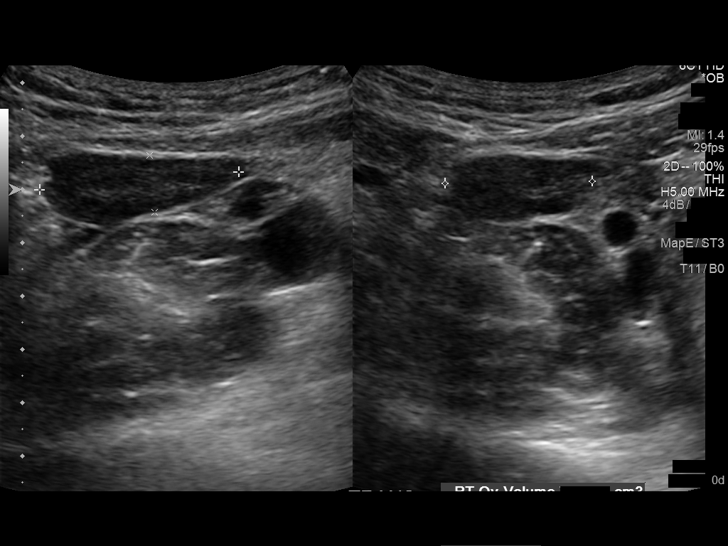
[im 25/94]
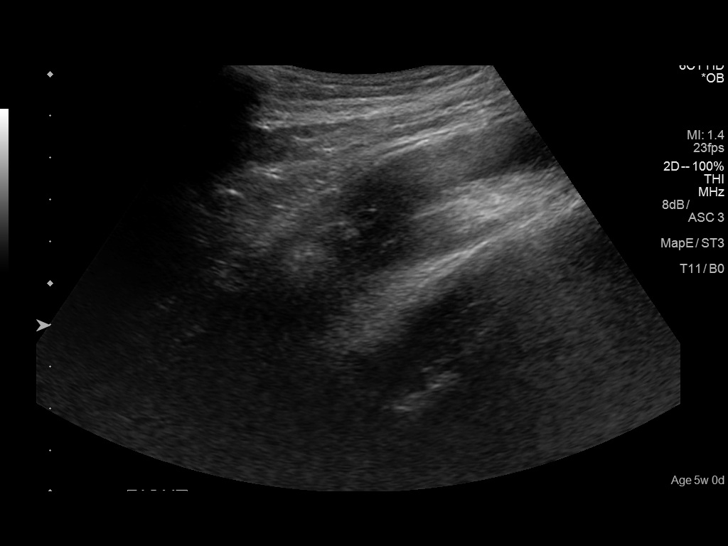
[im 32/94]
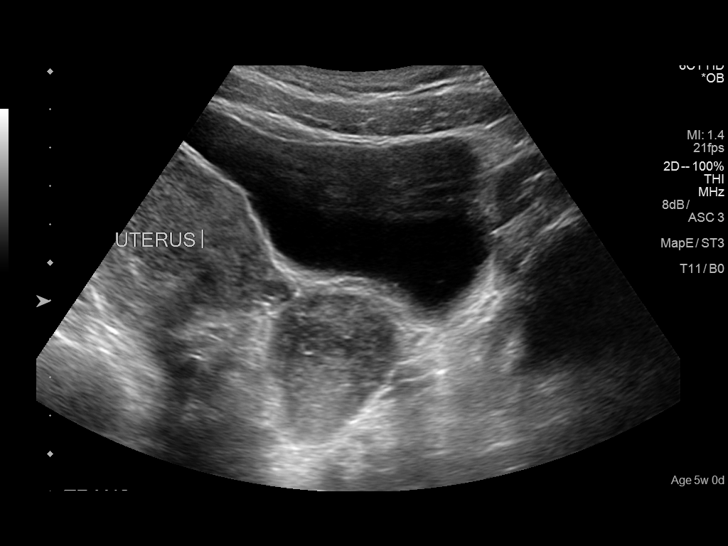
[im 38/94]
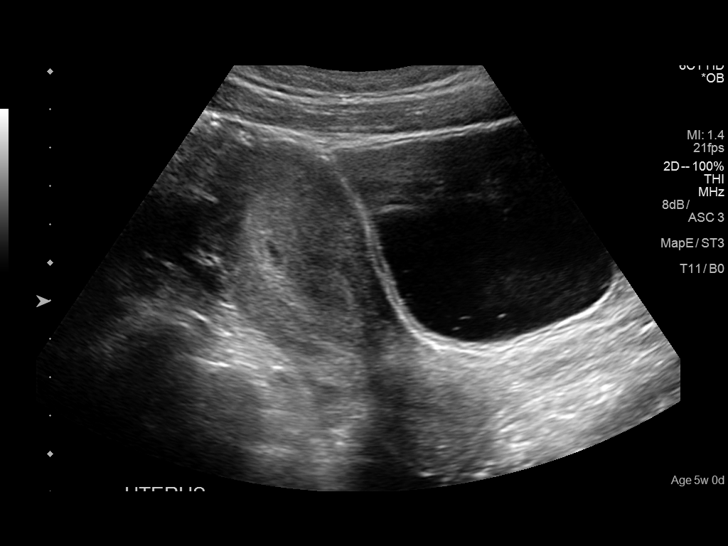
[im 49/94]
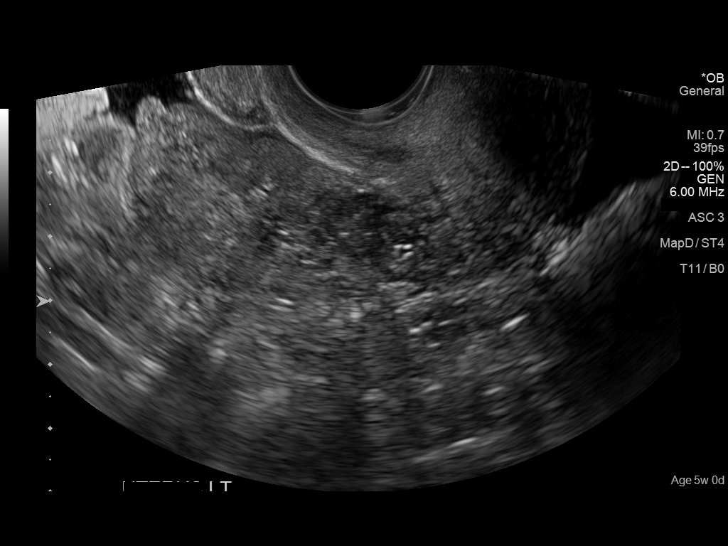
[im 56/94]
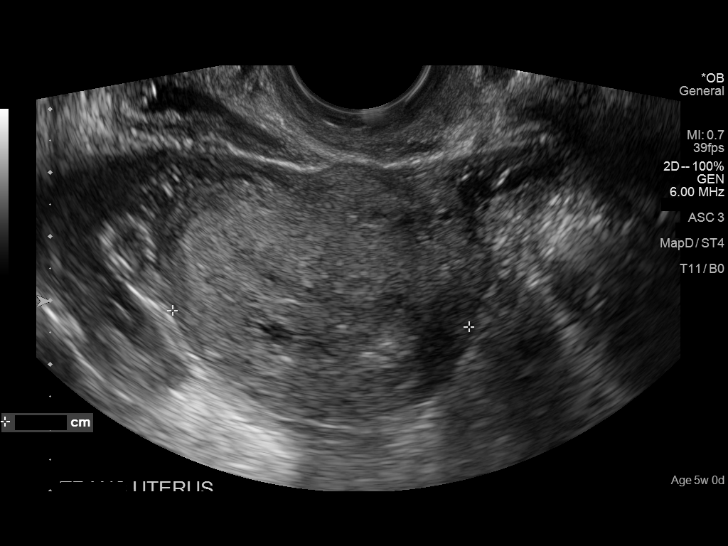
[im 63/94]
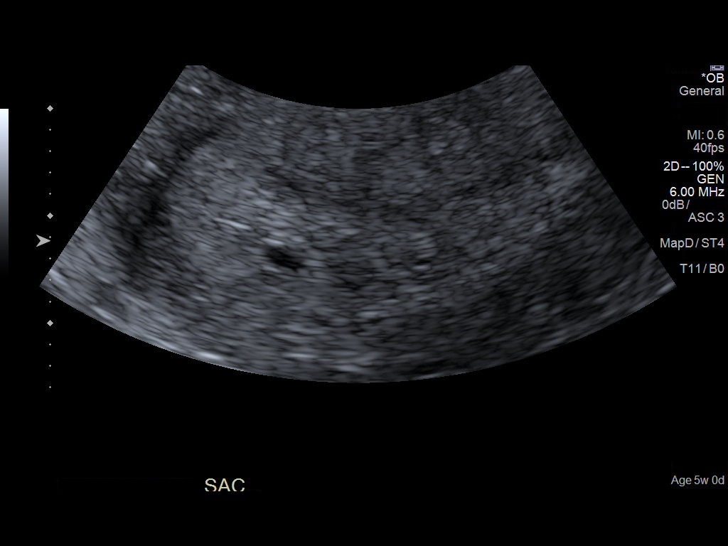
[im 69/94]
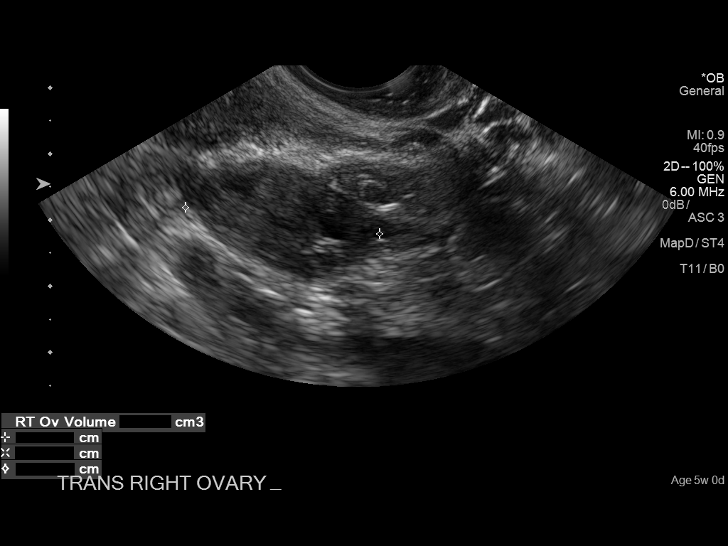
[im 76/94]
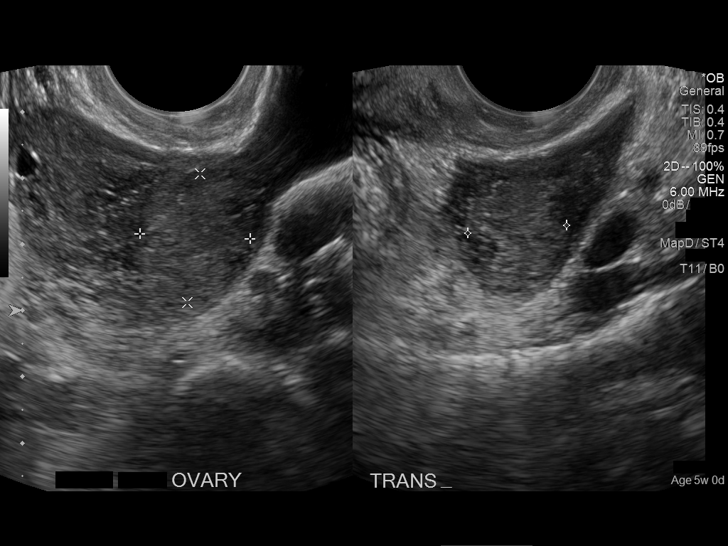
[im 83/94]
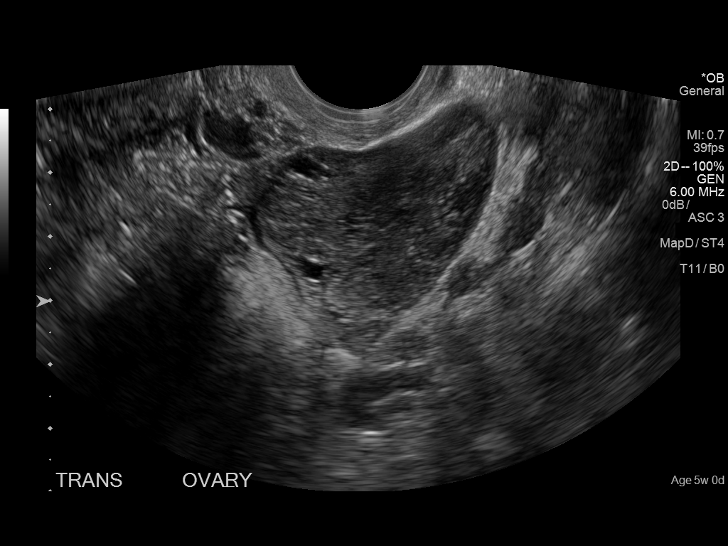
[im 90/94]
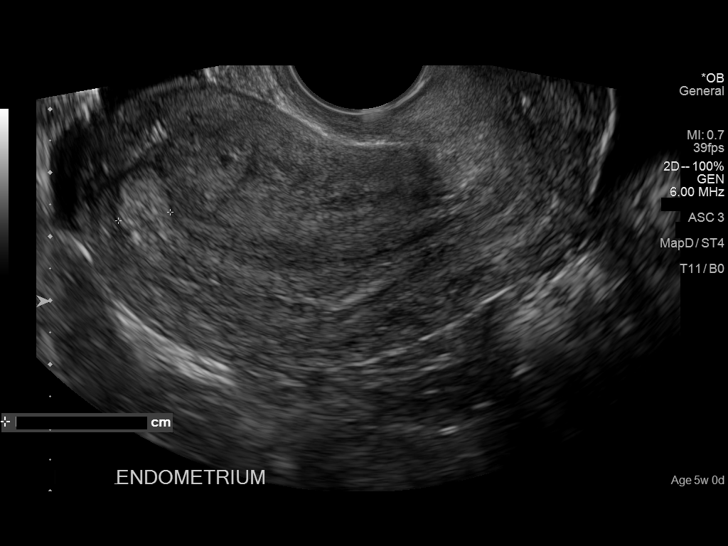

[13 of 28 positions shown; findings below may reference images not displayed]

FINDINGS: Intrauterine gestational sac: Small cystic collection is
demonstrated in the endometrium, likely representing an early
intrauterine gestational sac.

Yolk sac:  Not identified.

Embryo:  Not identified.

Cardiac Activity: Not identified.

MSD: 4.8  mm   5 w 2 d              US EDC: 08/13/2016

Subchorionic hemorrhage:  None visualized.

Maternal uterus/adnexae: The uterus is anteverted and measures 9.8 x
3.9 x 5 cm. No myometrial mass lesions are demonstrated.

Right ovary measures 3 x 1.6 x 3 cm. Normal follicular changes are
demonstrated.

Left ovary measures 4 x 3.2 x 2.9 cm. Focal hyperechoic area within
the left ovary probably represents a hemorrhagic corpus luteal cyst.

Small amount of free fluid in the pelvis.
IMPRESSION: Probable early intrauterine gestational sac, but no yolk sac, fetal
pole, or cardiac activity yet visualized. Recommend follow-up
quantitative B-HCG levels and follow-up US in 14 days to confirm and
assess viability. This recommendation follows SRU consensus
guidelines: Diagnostic Criteria for Nonviable Pregnancy Early in the
First Trimester. N Engl J Med 7126; [DATE].

## 2017-12-10 ENCOUNTER — Other Ambulatory Visit: Payer: Self-pay

## 2017-12-10 ENCOUNTER — Ambulatory Visit (HOSPITAL_COMMUNITY)
Admission: EM | Admit: 2017-12-10 | Discharge: 2017-12-10 | Disposition: A | Discharge status: Home / Self Care | Payer: Medicaid Other | Attending: Family Medicine | Admitting: Family Medicine

## 2017-12-10 ENCOUNTER — Encounter (HOSPITAL_COMMUNITY): Payer: Self-pay | Admitting: Emergency Medicine

## 2017-12-10 DIAGNOSIS — Z87891 Personal history of nicotine dependence: Secondary | ICD-10-CM | POA: Insufficient documentation

## 2017-12-10 DIAGNOSIS — N898 Other specified noninflammatory disorders of vagina: Secondary | ICD-10-CM | POA: Diagnosis not present

## 2017-12-10 DIAGNOSIS — N390 Urinary tract infection, site not specified: Secondary | ICD-10-CM | POA: Diagnosis present

## 2017-12-10 DIAGNOSIS — Z79899 Other long term (current) drug therapy: Secondary | ICD-10-CM | POA: Diagnosis not present

## 2017-12-10 DIAGNOSIS — Z3202 Encounter for pregnancy test, result negative: Secondary | ICD-10-CM

## 2017-12-10 DIAGNOSIS — Z87440 Personal history of urinary (tract) infections: Secondary | ICD-10-CM | POA: Diagnosis not present

## 2017-12-10 DIAGNOSIS — J45909 Unspecified asthma, uncomplicated: Secondary | ICD-10-CM | POA: Diagnosis not present

## 2017-12-10 DIAGNOSIS — R350 Frequency of micturition: Secondary | ICD-10-CM | POA: Diagnosis not present

## 2017-12-10 LAB — POCT URINALYSIS DIP (DEVICE)
Bilirubin Urine: NEGATIVE
GLUCOSE, UA: NEGATIVE mg/dL
Hgb urine dipstick: NEGATIVE
Ketones, ur: NEGATIVE mg/dL
LEUKOCYTES UA: NEGATIVE
NITRITE: NEGATIVE
PROTEIN: NEGATIVE mg/dL
Specific Gravity, Urine: 1.025 (ref 1.005–1.030)
UROBILINOGEN UA: 0.2 mg/dL (ref 0.0–1.0)
pH: 6.5 (ref 5.0–8.0)

## 2017-12-10 LAB — POCT PREGNANCY, URINE: Preg Test, Ur: NEGATIVE

## 2017-12-10 MED ORDER — FLUCONAZOLE 150 MG PO TABS: 150.0000 mg | ORAL_TABLET | Freq: Every day | ORAL | 0 refills | Status: DC

## 2017-12-10 MED ORDER — METRONIDAZOLE 500 MG PO TABS: 500.0000 mg | ORAL_TABLET | Freq: Two times a day (BID) | ORAL | 0 refills | Status: DC

## 2017-12-10 NOTE — ED Notes (Signed)
Urine obtained while patient in lobby. Specimen in lab

## 2017-12-10 NOTE — Discharge Instructions (Signed)
You were treated empirically for yeast and bacterial vaginitis. Flagyl and diflucan as directed. Cytology sent, you will be contacted with any positive results that requires further treatment. Refrain from sexual activity and alcohol use for the next 7 days. Monitor for any worsening of symptoms, fever, abdominal pain, nausea, vomiting, to follow up for reevaluation.

## 2017-12-10 NOTE — ED Triage Notes (Signed)
Reports vaginal discharge and uti symptoms for 5 days.  Denies burning with urination, but has frequent urination

## 2017-12-10 NOTE — ED Provider Notes (Signed)
[facility]    CSN: [id number] Arrival date & time: 12/10/17  1353     History   Chief Complaint Chief Complaint  Patient presents with  . Urinary Tract Infection    HPI Christina Willis is a 22 y.o. female.   22 year old female comes in for 5-day history of vaginal discharge with odor.  She has also experienced urinary frequency without dysuria or hematuria.  Denies abdominal pain, nausea, vomiting.  Denies fever, chills, night sweats.  Denies vaginal itching/pain, spotting.  She is sexually active with one female partner, occasional condom use.  LMP 11/20/2017.  No other birth control use.  Patient states that symptoms are similar to BV infections, but wanted to make sure she does not have a UTI as well. Not breastfeeding.       Past Medical History:  Diagnosis Date  . Asthma     Patient Active Problem List   Diagnosis Date Noted  . Supervision of normal pregnancy 04/04/2016  . Rh negative, antepartum 04/04/2016    Past Surgical History:  Procedure Laterality Date  . NO PAST SURGERIES      OB History    Gravida Para Term Preterm AB Living   1             SAB TAB Ectopic Multiple Live Births                   Home Medications    Prior to Admission medications   Medication Sig Start Date End Date Taking? Authorizing Provider  acetaminophen (TYLENOL) 325 MG tablet Take 650 mg by mouth every 6 (six) hours as needed for mild pain. Reported on 04/04/2016    [provider]  acetaminophen-codeine (TYLENOL #3) 300-30 MG tablet Take 1 tablet by mouth every 6 (six) hours as needed for moderate pain. Reported on 04/04/2016    [provider]  acyclovir (ZOVIRAX) 400 MG tablet Take 400 mg by mouth See admin instructions. Reported on 04/04/2016 12/18/15   [provider]  albuterol (PROAIR HFA) 108 (90 Base) MCG/ACT inhaler Inhale 2 puffs into the lungs every 4 (four) hours as needed for wheezing or shortness of breath (asthma). Reported  on 04/04/2016    [provider]  fluconazole (DIFLUCAN) 150 MG tablet Take 1 tablet (150 mg total) by mouth daily. Take second dose 72 hours later if symptoms still persists. 12/10/17   Cathie Hoops, Amy V, PA-C  metroNIDAZOLE (FLAGYL) 500 MG tablet Take 1 tablet (500 mg total) by mouth 2 (two) times daily. 12/10/17   Cathie Hoops, Amy V, PA-C  Olopatadine HCl (PAZEO) 0.7 % SOLN Place 1 drop into both eyes 2 (two) times daily as needed (itching/ seasonal allergies). Reported on 04/04/2016    [provider]  Prenatal Multivit-Min-Fe-FA (PRENATAL VITAMINS) 0.8 MG tablet Take 1 tablet by mouth daily. 05/14/16   Constant, Peggy, MD  Prenatal Vit-Fe Fumarate-FA (PRENATAL MULTIVITAMIN) TABS tablet Take 1 tablet by mouth daily.     [provider]    Family History Family History  Problem Relation Age of Onset  . Cancer Neg Hx   . Diabetes Neg Hx   . Hypertension Neg Hx     Social History Social History   Tobacco Use  . Smoking status: Former Games developer  . Smokeless tobacco: Never Used  Substance Use Topics  . Alcohol use: No  . Drug use: No     Allergies   Patient has no known allergies.   Review of Systems Review  of Systems  Reason unable to perform ROS: See HPI as above.     Physical Exam Triage Vital Signs ED Triage Vitals [12/10/17 1508]  Enc Vitals Group     BP 113/67     Pulse Rate 86     Resp 18     Temp 98 F (36.7 C)     Temp Source Oral     SpO2 99 %     Weight      Height      Head Circumference      Peak Flow      Pain Score      Pain Loc      Pain Edu?      Excl. in GC?    No data found.  Updated Vital Signs BP 113/67 (BP Location: Left Arm)   Pulse 86   Temp 98 F (36.7 C) (Oral)   Resp 18   LMP 11/20/2017   SpO2 99%    Physical Exam  Constitutional: She is oriented to person, place, and time. She appears well-developed and well-nourished. No distress.  HENT:  Head: Normocephalic and atraumatic.  Eyes: Conjunctivae are normal. Pupils  are equal, round, and reactive to light.  Neurological: She is alert and oriented to person, place, and time.     UC Treatments / Results  Labs (all labs ordered are listed, but only abnormal results are displayed) Labs Reviewed  POCT URINALYSIS DIP (DEVICE)  POCT PREGNANCY, URINE  URINE CYTOLOGY ANCILLARY ONLY    EKG  EKG Interpretation None       Radiology No results found.  Procedures Procedures (including critical care time)  Medications Ordered in UC Medications - No data to display   Initial Impression / Assessment and Plan / UC Course  I have reviewed the triage vital signs and the nursing notes.  Pertinent labs & imaging results that were available during my care of the patient were reviewed by me and considered in my medical decision making (see chart for details).    Patient was treated empirically for yeast, BV. Start diflucan and flagyl as directed. Cytology sent, patient will be contacted with any positive results that require additional treatment. Patient to refrain from sexual activity for the next 7 days. Return precautions given.    Final Clinical Impressions(s) / UC Diagnoses   Final diagnoses:  Vaginal discharge    ED Discharge Orders        Ordered    metroNIDAZOLE (FLAGYL) 500 MG tablet  2 times daily     12/10/17 1549    fluconazole (DIFLUCAN) 150 MG tablet  Daily     12/10/17 1549       [address]Yu, Amy V, PA-C 12/10/17 1552

## 2017-12-11 LAB — URINE CYTOLOGY ANCILLARY ONLY
CHLAMYDIA, DNA PROBE: NEGATIVE
NEISSERIA GONORRHEA: NEGATIVE
Trichomonas: NEGATIVE

## 2017-12-12 LAB — URINE CYTOLOGY ANCILLARY ONLY
Bacterial vaginitis: POSITIVE — AB
Candida vaginitis: NEGATIVE

## 2018-03-13 ENCOUNTER — Other Ambulatory Visit (HOSPITAL_COMMUNITY)
Admission: RE | Admit: 2018-03-13 | Discharge: 2018-03-13 | Disposition: A | Discharge status: Home / Self Care | Payer: Medicaid Other | Source: Ambulatory Visit | Attending: Obstetrics and Gynecology | Admitting: Obstetrics and Gynecology

## 2018-03-13 ENCOUNTER — Encounter: Payer: Self-pay | Admitting: Obstetrics and Gynecology

## 2018-03-13 ENCOUNTER — Ambulatory Visit (INDEPENDENT_AMBULATORY_CARE_PROVIDER_SITE_OTHER): Payer: Medicaid Other | Admitting: Certified Nurse Midwife

## 2018-03-13 VITALS — BP 108/73 | HR 80 | Ht 65.0 in | Wt 169.3 lb

## 2018-03-13 DIAGNOSIS — N760 Acute vaginitis: Secondary | ICD-10-CM | POA: Insufficient documentation

## 2018-03-13 NOTE — Progress Notes (Signed)
Pt is here with vaginal discharge and itching x3 days. Pt states she gets frequent yeast infections. Pt requests STD testing.

## 2018-03-13 NOTE — Progress Notes (Signed)
I have reviewed the chart and agree with nursing staff's documentation of this patient's encounter.  Roe CoombsRachelle A Didier Brandenburg, CNM 03/13/2018 9:22 AM

## 2018-03-14 LAB — HEPATITIS C ANTIBODY

## 2018-03-14 LAB — RPR: RPR Ser Ql: NONREACTIVE

## 2018-03-14 LAB — HIV ANTIBODY (ROUTINE TESTING W REFLEX): HIV Screen 4th Generation wRfx: NONREACTIVE

## 2018-03-14 LAB — HEPATITIS B SURFACE ANTIGEN: HEP B S AG: NEGATIVE

## 2018-03-16 LAB — CERVICOVAGINAL ANCILLARY ONLY
BACTERIAL VAGINITIS: POSITIVE — AB
CANDIDA VAGINITIS: POSITIVE — AB
Chlamydia: NEGATIVE
Neisseria Gonorrhea: NEGATIVE
TRICH (WINDOWPATH): NEGATIVE

## 2018-03-19 MED ORDER — FLUCONAZOLE 150 MG PO TABS: 150.0000 mg | ORAL_TABLET | Freq: Every day | ORAL | 1 refills | Status: DC

## 2018-03-19 MED ORDER — METRONIDAZOLE 500 MG PO TABS: 500.0000 mg | ORAL_TABLET | Freq: Two times a day (BID) | ORAL | 1 refills | Status: DC

## 2018-03-19 NOTE — Addendum Note (Signed)
Addended by: Lincoln BingPICKENS, Bates [place] on: 03/19/2018 11:16 AM   Modules accepted: Orders

## 2018-05-19 IMAGING — US US MFM OB COMP +14 WKS
1 series · 14 of 28 positions shown · non-contrast
Comparison: none

[Series 1: us mfm ob comp +14 wks · 14 of 78 slices shown]
[im 3/78]
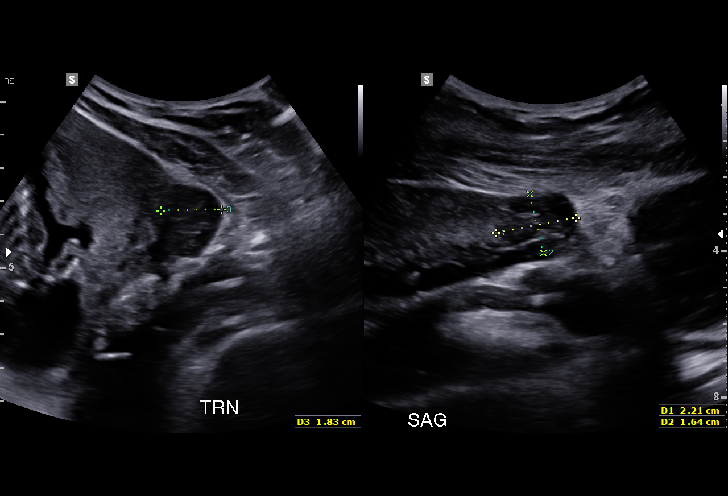
[im 9/78]
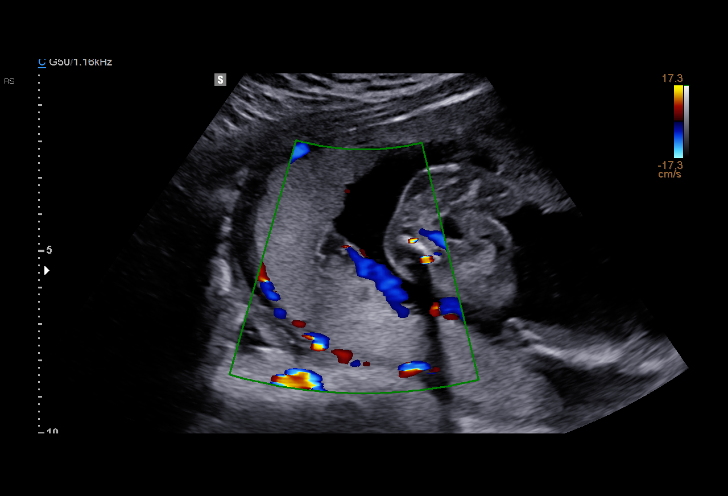
[im 15/78]
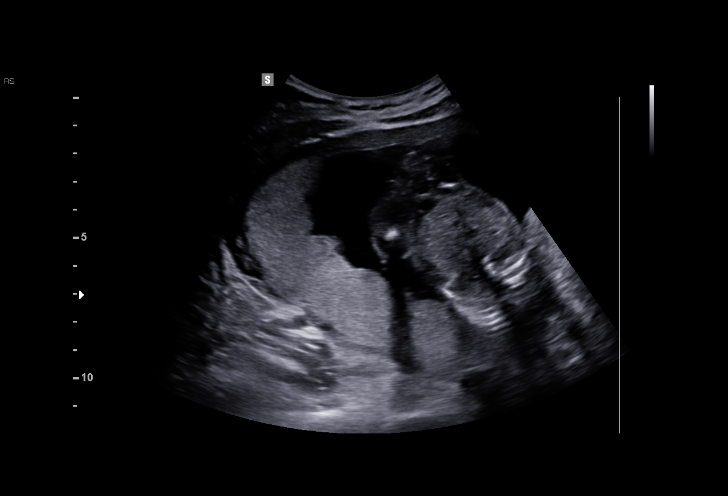
[im 20/78]
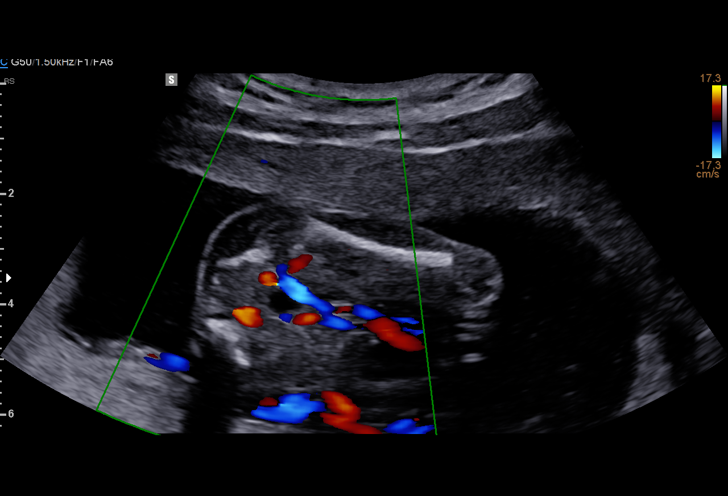
[im 26/78]
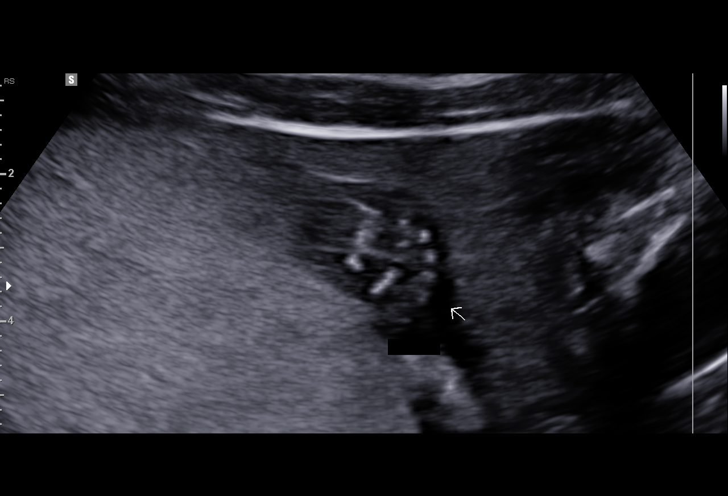
[im 32/78]
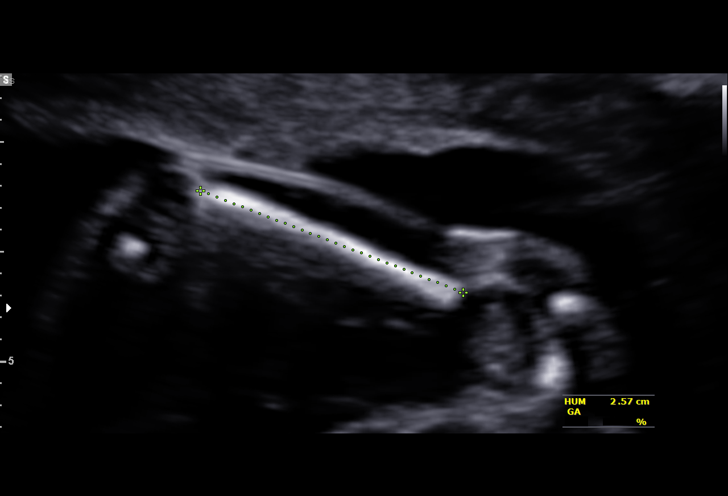
[im 38/78]
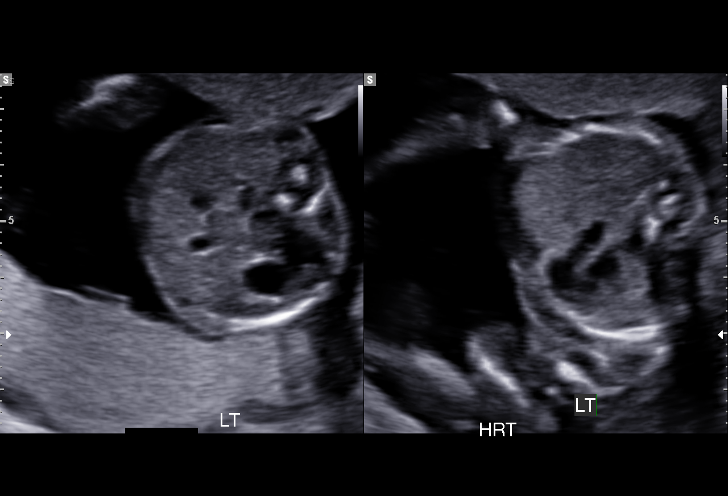
[im 43/78]
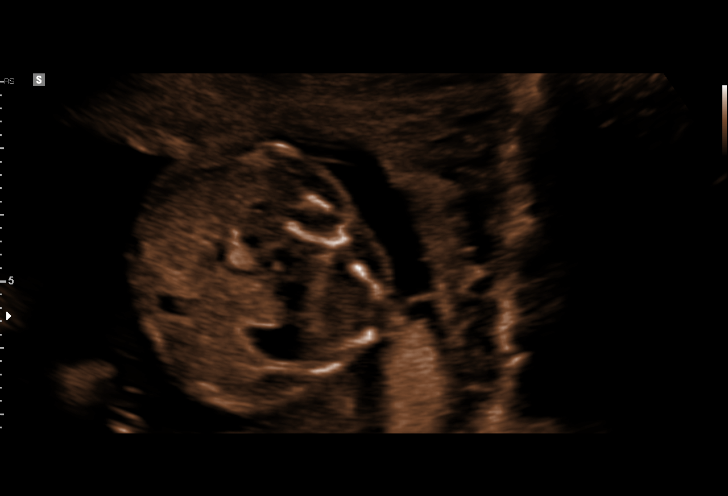
[im 49/78]
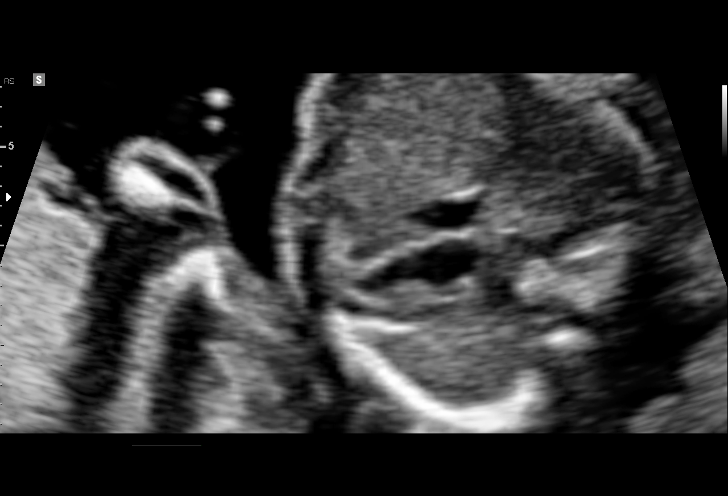
[im 55/78]
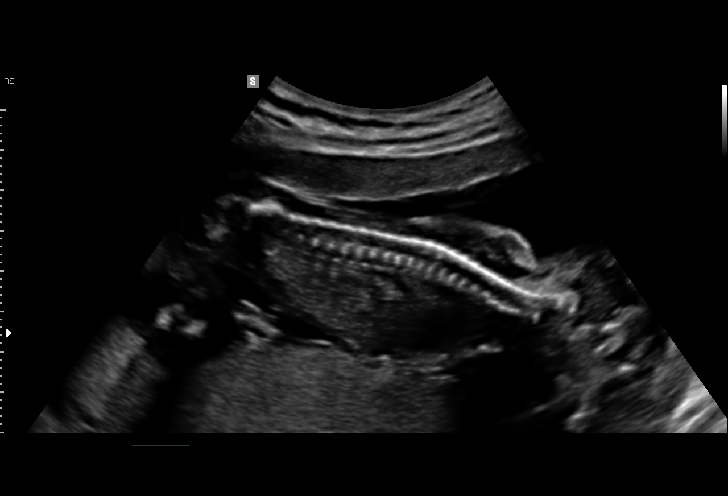
[im 60/78]
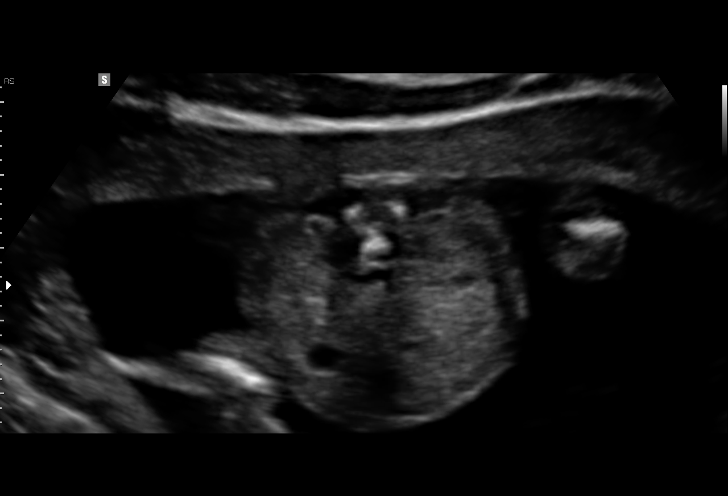
[im 66/78]
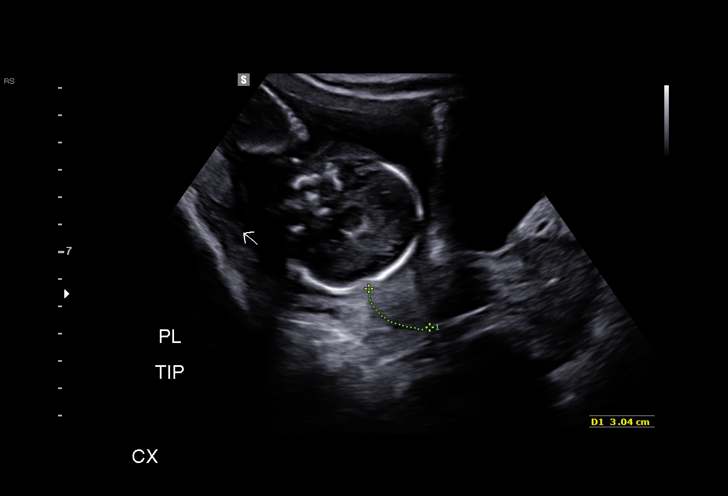
[im 72/78]
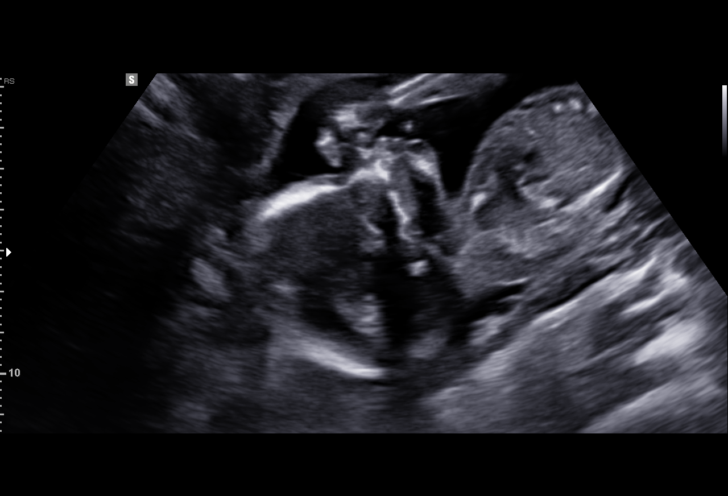
[im 78/78]
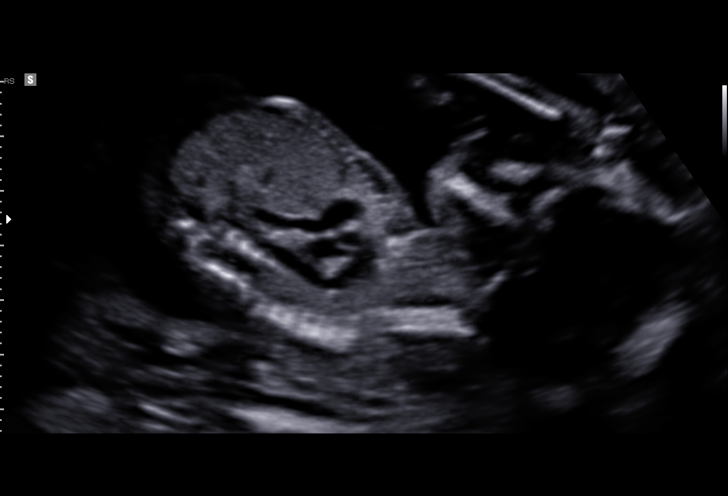

[14 of 28 positions shown; findings below may reference images not displayed]

1  MANIOKAS                  [id number]      [id number]     [id number]
SERANI
Indications

18 weeks gestation of pregnancy
Basic anatomic survey                          Z36
OB History

Gravidity:    1
Fetal Evaluation

Num Of Fetuses:     1
Fetal Heart         140
Rate(bpm):
Cardiac Activity:   Observed
Presentation:       Cephalic
Placenta:           Posterior, above cervical os
P. Cord Insertion:  Visualized

Amniotic Fluid
AFI FV:      Subjectively within normal limits

Largest Pocket(cm)
5.34
Biometry

BPD:      42.9  mm     G. Age:  19w 0d         69  %    CI:         70.13  %    70 - 86
FL/HC:       16.2  %    16.1 -
HC:      163.4  mm     G. Age:  19w 1d         68  %    HC/AC:       1.24       1.09 -
AC:      131.4  mm     G. Age:  18w 4d         49  %    FL/BPD:      61.5  %
FL:       26.4  mm     G. Age:  18w 0d         25  %    FL/AC:       20.1  %    20 - 24
HUM:      25.8  mm     G. Age:  18w 0d         40  %

Est. FW:     241   gm     0 lb 9 oz     44  %
Gestational Age

LMP:           18w 4d        Date:  11/09/15                 EDD:    08/15/16
U/S Today:     18w 5d                                        EDD:    08/14/16
Best:          18w 4d     Det. By:  LMP  (11/09/15)          EDD:    08/15/16
Anatomy

Cranium:               Appears normal         Aortic Arch:            Appears normal
Cavum:                 Not well visualized    Ductal Arch:            Not well visualized
Ventricles:            Not well visualized    Diaphragm:              Appears normal
Choroid Plexus:        Appears normal         Stomach:                Appears normal, left
sided
Cerebellum:            Not well visualized    Abdomen:                Appears normal
Posterior Fossa:       Not well visualized    Abdominal Wall:         Not well visualized
Nuchal Fold:           Not well visualized    Cord Vessels:           Appears normal (3
vessel cord)
Face:                  Orbits appear          Kidneys:                Appear normal
normal
Lips:                  Not well visualized    Bladder:                Appears normal
Thoracic:              Appears normal         Spine:                  Appears normal
Heart:                 Not well visualized    Upper Extremities:      Appears normal
RVOT:                  Not well visualized    Lower Extremities:      Appears normal
LVOT:                  Appears normal

Other:  Fetus appears to be a female. Heels visualized. Technically difficult
due to fetal position.
Cervix Uterus Adnexa

Cervix
Length:           3.04  cm.
Normal appearance by transabdominal scan.

Uterus
No abnormality visualized.

Left Ovary
Within normal limits.

Right Ovary
Within normal limits.

Adnexa:       No abnormality visualized. No adnexal mass
visualized.
Impression

SIUP at 18+4 weeks
Normal detailed fetal anatomy; limited views of intracranial
anatomy, face, heart and CI due to fetal position
Markers of aneuploidy: none
Normal amniotic fluid volume
Measurements consistent with LMP dating
Recommendations

Follow-up ultrasound in 4-6 weeks to complete anatomy
survey

## 2018-05-29 ENCOUNTER — Telehealth: Payer: Self-pay | Admitting: *Deleted

## 2018-05-29 NOTE — Telephone Encounter (Signed)
Refill request from pharmacy for Metronidazole 500mg .  Attempt to contact pt to discuss need for refill. LM on VM that request to be sent to provider.   Please send refill if approved.

## 2018-05-31 NOTE — Telephone Encounter (Signed)
Decline Rx. She is due for an annual exam first.

## 2018-06-01 NOTE — Telephone Encounter (Signed)
Pt informed. Transferred to front desk to schedule appt

## 2018-06-25 ENCOUNTER — Ambulatory Visit (INDEPENDENT_AMBULATORY_CARE_PROVIDER_SITE_OTHER): Payer: Medicaid Other | Admitting: *Deleted

## 2018-06-25 VITALS — BP 122/79 | HR 87 | Wt 172.0 lb

## 2018-06-25 DIAGNOSIS — R309 Painful micturition, unspecified: Secondary | ICD-10-CM | POA: Diagnosis not present

## 2018-06-25 LAB — POCT URINALYSIS DIPSTICK
Bilirubin, UA: NEGATIVE
Glucose, UA: NEGATIVE
KETONES UA: NEGATIVE
LEUKOCYTES UA: NEGATIVE
NITRITE UA: NEGATIVE
PH UA: 6.5 (ref 5.0–8.0)
Protein, UA: NEGATIVE
Spec Grav, UA: 1.015 (ref 1.010–1.025)
UROBILINOGEN UA: 0.2 U/dL

## 2018-06-25 NOTE — Progress Notes (Signed)
Pt is in office for Urine check. Pt states she is having some discomfort when urinating. Pt will have Udip and UC sent today. Will await results before treating, pt aware.  BP 122/79   Pulse 87   Wt 172 lb (78 kg)   BMI 28.62 kg/m   Urine dipstick shows negative for all other components, positive for red blood cells.

## 2018-06-27 LAB — URINE CULTURE

## 2018-06-29 ENCOUNTER — Encounter (HOSPITAL_COMMUNITY): Payer: Self-pay | Admitting: Emergency Medicine

## 2018-06-29 ENCOUNTER — Emergency Department (HOSPITAL_COMMUNITY)
Admission: EM | Admit: 2018-06-29 | Discharge: 2018-06-29 | Disposition: A | Discharge status: Home / Self Care | Payer: Medicaid Other | Attending: Emergency Medicine | Admitting: Emergency Medicine

## 2018-06-29 DIAGNOSIS — J45909 Unspecified asthma, uncomplicated: Secondary | ICD-10-CM | POA: Insufficient documentation

## 2018-06-29 DIAGNOSIS — H9201 Otalgia, right ear: Secondary | ICD-10-CM | POA: Diagnosis present

## 2018-06-29 DIAGNOSIS — Z87891 Personal history of nicotine dependence: Secondary | ICD-10-CM | POA: Insufficient documentation

## 2018-06-29 DIAGNOSIS — H60501 Unspecified acute noninfective otitis externa, right ear: Secondary | ICD-10-CM

## 2018-06-29 DIAGNOSIS — Z79899 Other long term (current) drug therapy: Secondary | ICD-10-CM | POA: Diagnosis not present

## 2018-06-29 MED ORDER — CIPROFLOXACIN-HYDROCORTISONE 0.2-1 % OT SUSP: 3.0000 [drp] | Freq: Two times a day (BID) | OTIC | 0 refills | Status: DC

## 2018-06-29 NOTE — ED Triage Notes (Signed)
Pt c/o right ear pain since Friday.

## 2018-06-29 NOTE — ED Provider Notes (Signed)
[facility]ENCY [facility]PT Provider Note   CSN: [id number] Arrival date & time: 06/29/18  1425     History   Chief Complaint Chief Complaint  Patient presents with  . Otalgia    HPI Christina Willis is a 22 y.o. female.  The history is provided by the patient. No language interpreter was used.  Otalgia    Christina Willis is a 22 y.o. female who presents to the Emergency Department complaining of ear ache. He presents to the ED for evaluation of the ear ache that began on Friday. She notes pain to the right ear that started with some local bleeding. She has pain in the ear when she smiles or moves her face. No injury to the ear. She denies any fevers, sore throat, nausea, vomiting, dizziness. No prior similar symptoms. Symptoms are mild in nature.  Past Medical History:  Diagnosis Date  . Asthma     Patient Active Problem List   Diagnosis Date Noted  . Supervision of normal pregnancy 04/04/2016  . Rh negative, antepartum 04/04/2016    Past Surgical History:  Procedure Laterality Date  . NO PAST SURGERIES       OB History    Gravida  1   Para      Term      Preterm      AB      Living        SAB      TAB      Ectopic      Multiple      Live Births               Home Medications    Prior to Admission medications   Medication Sig Start Date End Date Taking? Authorizing Provider  acetaminophen (TYLENOL) 325 MG tablet Take 650 mg by mouth every 6 (six) hours as needed for mild pain. Reported on 04/04/2016    [provider]  acetaminophen-codeine (TYLENOL #3) 300-30 MG tablet Take 1 tablet by mouth every 6 (six) hours as needed for moderate pain. Reported on 04/04/2016    [provider]  acyclovir (ZOVIRAX) 400 MG tablet Take 400 mg by mouth See admin instructions. Reported on 04/04/2016 12/18/15   [provider]  albuterol (PROAIR HFA) 108 (90 Base) MCG/ACT inhaler Inhale 2 puffs into the lungs  every 4 (four) hours as needed for wheezing or shortness of breath (asthma). Reported on 04/04/2016    [provider]  ciprofloxacin-hydrocortisone (CIPRO HC) OTIC suspension Place 3 drops into the right ear 2 (two) times daily. For seven to ten days. 06/29/18   Tilden Fossa, MD  fluconazole (DIFLUCAN) 150 MG tablet Take 1 tablet (150 mg total) by mouth daily. Take second dose 72 hours later if symptoms still persists. 03/19/18   [place] Bing, MD  metroNIDAZOLE (FLAGYL) 500 MG tablet Take 1 tablet (500 mg total) by mouth 2 (two) times daily. 03/19/18   [place] Bing, MD  Olopatadine HCl (PAZEO) 0.7 % SOLN Place 1 drop into both eyes 2 (two) times daily as needed (itching/ seasonal allergies). Reported on 04/04/2016    [provider]  Prenatal Multivit-Min-Fe-FA (PRENATAL VITAMINS) 0.8 MG tablet Take 1 tablet by mouth daily. Patient not taking: Reported on 03/13/2018 05/14/16   Constant, Peggy, MD  Prenatal Vit-Fe Fumarate-FA (PRENATAL MULTIVITAMIN) TABS tablet Take 1 tablet by mouth daily.     [provider]    Family History Family History  Problem Relation Age of Onset  .  Cancer Neg Hx   . Diabetes Neg Hx   . Hypertension Neg Hx     Social History Social History   Tobacco Use  . Smoking status: Former Games developer  . Smokeless tobacco: Never Used  Substance Use Topics  . Alcohol use: No  . Drug use: No     Allergies   Apple and Banana   Review of Systems Review of Systems  HENT: Positive for ear pain.   All other systems reviewed and are negative.    Physical Exam Updated Vital Signs Pulse 91   Temp 98.2 F (36.8 C) (Oral)   Resp 16   LMP 06/18/2018   SpO2 100%   Physical Exam  Constitutional: She is oriented to person, place, and time. She appears well-developed and well-nourished.  HENT:  Head: Normocephalic and atraumatic.  Left TM wnl.  Right TM wnl.  Mild erythema and edema to right ear canal without exudate.  No mastoid  tenderness  Cardiovascular: Normal rate and regular rhythm.  No murmur heard. Pulmonary/Chest: Effort normal and breath sounds normal. No respiratory distress.  Musculoskeletal: She exhibits no edema or tenderness.  Neurological: She is alert and oriented to person, place, and time.  Skin: Skin is warm and dry.  Psychiatric: She has a normal mood and affect. Her behavior is normal.  Nursing note and vitals reviewed.    ED Treatments / Results  Labs (all labs ordered are listed, but only abnormal results are displayed) Labs Reviewed - No data to display  EKG None  Radiology No results found.  Procedures Procedures (including critical care time)  Medications Ordered in ED Medications - No data to display   Initial Impression / Assessment and Plan / ED Course  I have reviewed the triage vital signs and the nursing notes.  Pertinent labs & imaging results that were available during my care of the patient were reviewed by me and considered in my medical decision making (see chart for details).     Patient here for evaluation of right ear ache since Friday. She is well appearing on examination and non-toxic. She does have some swelling and erythema to the canal concerning for otitis externa. TM is intact with no evidence of rupture or acute otitis media. Discussed with patient home care for otitis externa. Discussed outpatient follow-up and return precautions.  Final Clinical Impressions(s) / ED Diagnoses   Final diagnoses:  Acute otitis externa of right ear, unspecified type    ED Discharge Orders         Ordered    ciprofloxacin-hydrocortisone (CIPRO HC) OTIC suspension  2 times daily     06/29/18 1550           Tilden Fossa, MD 06/29/18 1600

## 2018-09-02 ENCOUNTER — Ambulatory Visit: Payer: Medicaid Other | Admitting: Obstetrics

## 2019-01-28 ENCOUNTER — Other Ambulatory Visit: Payer: Self-pay | Admitting: *Deleted

## 2019-01-28 MED ORDER — METRONIDAZOLE 500 MG PO TABS: 500.0000 mg | ORAL_TABLET | Freq: Two times a day (BID) | ORAL | 1 refills | Status: DC

## 2019-01-28 NOTE — Progress Notes (Signed)
Pt called to office with yellow d/c and slight odor. Per Dr Clearance Coots, may treat with Flagyl and if no relief will come in for selfswab.

## 2019-03-16 ENCOUNTER — Encounter: Payer: Medicaid Other | Admitting: Advanced Practice Midwife

## 2019-03-16 DIAGNOSIS — Z3480 Encounter for supervision of other normal pregnancy, unspecified trimester: Secondary | ICD-10-CM | POA: Insufficient documentation

## 2019-03-23 ENCOUNTER — Encounter: Payer: Self-pay | Admitting: Obstetrics & Gynecology

## 2019-03-24 ENCOUNTER — Encounter: Payer: Medicaid Other | Admitting: Obstetrics & Gynecology

## 2019-05-22 ENCOUNTER — Inpatient Hospital Stay (HOSPITAL_COMMUNITY)
Admission: AD | Admit: 2019-05-22 | Discharge: 2019-05-22 | Disposition: A | Discharge status: Home / Self Care | Payer: Medicaid Other | Attending: Obstetrics and Gynecology | Admitting: Obstetrics and Gynecology

## 2019-05-22 ENCOUNTER — Encounter (HOSPITAL_COMMUNITY): Payer: Self-pay | Admitting: *Deleted

## 2019-05-22 ENCOUNTER — Other Ambulatory Visit: Payer: Self-pay

## 2019-05-22 DIAGNOSIS — M7918 Myalgia, other site: Secondary | ICD-10-CM

## 2019-05-22 DIAGNOSIS — O26892 Other specified pregnancy related conditions, second trimester: Secondary | ICD-10-CM

## 2019-05-22 DIAGNOSIS — R102 Pelvic and perineal pain: Secondary | ICD-10-CM | POA: Diagnosis not present

## 2019-05-22 DIAGNOSIS — N898 Other specified noninflammatory disorders of vagina: Secondary | ICD-10-CM | POA: Insufficient documentation

## 2019-05-22 DIAGNOSIS — Z79899 Other long term (current) drug therapy: Secondary | ICD-10-CM | POA: Insufficient documentation

## 2019-05-22 DIAGNOSIS — R300 Dysuria: Secondary | ICD-10-CM | POA: Insufficient documentation

## 2019-05-22 DIAGNOSIS — Z87891 Personal history of nicotine dependence: Secondary | ICD-10-CM | POA: Insufficient documentation

## 2019-05-22 DIAGNOSIS — Z3492 Encounter for supervision of normal pregnancy, unspecified, second trimester: Secondary | ICD-10-CM

## 2019-05-22 DIAGNOSIS — Z3A20 20 weeks gestation of pregnancy: Secondary | ICD-10-CM

## 2019-05-22 DIAGNOSIS — J45909 Unspecified asthma, uncomplicated: Secondary | ICD-10-CM | POA: Insufficient documentation

## 2019-05-22 DIAGNOSIS — O99512 Diseases of the respiratory system complicating pregnancy, second trimester: Secondary | ICD-10-CM | POA: Insufficient documentation

## 2019-05-22 LAB — URINALYSIS, ROUTINE W REFLEX MICROSCOPIC
Bilirubin Urine: NEGATIVE
Glucose, UA: NEGATIVE mg/dL
Hgb urine dipstick: NEGATIVE
Ketones, ur: NEGATIVE mg/dL
Leukocytes,Ua: NEGATIVE
Nitrite: NEGATIVE
Protein, ur: NEGATIVE mg/dL
Specific Gravity, Urine: 1.016 (ref 1.005–1.030)
pH: 7 (ref 5.0–8.0)

## 2019-05-22 LAB — WET PREP, GENITAL
Clue Cells Wet Prep HPF POC: NONE SEEN
Sperm: NONE SEEN
Trich, Wet Prep: NONE SEEN
Yeast Wet Prep HPF POC: NONE SEEN

## 2019-05-22 NOTE — MAU Note (Signed)
Christina Willis is a 23 y.o. at [redacted]w[redacted]d here in MAU reporting: having pelvic pressure for the past week and it is mostly when she walks. Having burning with urination for 3-4 days. Having a thick discharge with odor, going on for 3-4 days.  Onset of complaint: ongoing  Pain score: 7/10  Vitals:   05/22/19 1909  BP: (!) 120/58  Pulse: 95  Resp: 18  Temp: 98.6 F (37 C)  SpO2: 100%     FHT:145  Lab orders placed from triage: UA

## 2019-05-22 NOTE — MAU Provider Note (Signed)
History     CSN: [id number]  Arrival date and time: 05/22/19 [id number]   First Provider Initiated Contact with Patient 05/22/19 1925      Chief Complaint  Patient presents with  . Pelvic Pain  . Vaginal Discharge  . Dysuria   HPI Christina Willis is a 23 y.o. G2P1001 at 3181w0d who presents to MAU with chief complaints of pelvic pain, abnormal vaginal discharge and mild dysuria.   Pelvic Pain This is a recurring problem, onset within the past week. Patient rates her pain as 6/10 but denies pain in MAU. Her pain is aggravated by standing, walking and position changes. She denies alleviating factors. She has not taken medication or tried other treatments for this complaint.  Abnormal vaginal discharge and mild dysuria These are new problems, onset 3-4 days ago. Patient endorses continuous white vaginal discharge, not foul smelling, not itchy. She denies leaking of fluid. Most recent intercourse two weeks ago.   OB History    Gravida  2   Para  1   Term  1   Preterm      AB      Living  1     SAB      TAB      Ectopic      Multiple      Live Births  1           Past Medical History:  Diagnosis Date  . Asthma     Past Surgical History:  Procedure Laterality Date  . NO PAST SURGERIES      Family History  Problem Relation Age of Onset  . Cancer Neg Hx   . Diabetes Neg Hx   . Hypertension Neg Hx     Social History   Tobacco Use  . Smoking status: Former Games developermoker  . Smokeless tobacco: Never Used  Substance Use Topics  . Alcohol use: No  . Drug use: No    Allergies:  Allergies  Allergen Reactions  . Apple Itching  . Banana Itching    Medications Prior to Admission  Medication Sig Dispense Refill Last Dose  . acetaminophen (TYLENOL) 325 MG tablet Take 650 mg by mouth every 6 (six) hours as needed for mild pain. Reported on 04/04/2016     . acetaminophen-codeine (TYLENOL #3) 300-30 MG tablet Take 1 tablet by mouth every 6 (six) hours as needed  for moderate pain. Reported on 04/04/2016     . acyclovir (ZOVIRAX) 400 MG tablet Take 400 mg by mouth See admin instructions. Reported on 04/04/2016  6   . albuterol (PROAIR HFA) 108 (90 Base) MCG/ACT inhaler Inhale 2 puffs into the lungs every 4 (four) hours as needed for wheezing or shortness of breath (asthma). Reported on 04/04/2016     . ciprofloxacin-hydrocortisone (CIPRO HC) OTIC suspension Place 3 drops into the right ear 2 (two) times daily. For seven to ten days. 10 mL 0   . fluconazole (DIFLUCAN) 150 MG tablet Take 1 tablet (150 mg total) by mouth daily. Take second dose 72 hours later if symptoms still persists. 2 tablet 1   . metroNIDAZOLE (FLAGYL) 500 MG tablet Take 1 tablet (500 mg total) by mouth 2 (two) times daily. 14 tablet 1   . Olopatadine HCl (PAZEO) 0.7 % SOLN Place 1 drop into both eyes 2 (two) times daily as needed (itching/ seasonal allergies). Reported on 04/04/2016     . Prenatal Multivit-Min-Fe-FA (PRENATAL VITAMINS) 0.8 MG tablet Take 1 tablet by mouth daily. (Patient  not taking: Reported on 03/13/2018) 30 tablet 12   . Prenatal Vit-Fe Fumarate-FA (PRENATAL MULTIVITAMIN) TABS tablet Take 1 tablet by mouth daily.        Review of Systems  Constitutional: Negative for chills, fatigue and fever.  Respiratory: Negative for shortness of breath.   Gastrointestinal: Negative for abdominal pain.  Genitourinary: Positive for dysuria, pelvic pain and vaginal discharge. Negative for difficulty urinating and flank pain.  Musculoskeletal: Negative for back pain.  All other systems reviewed and are negative.  Physical Exam   Blood pressure (!) 120/58, pulse 95, temperature 98.6 F (37 C), temperature source Oral, resp. rate 18, height 5\' 5"  (1.651 m), weight 92.1 kg, last menstrual period 01/02/2019, SpO2 100 %, unknown if currently breastfeeding.  Physical Exam  Nursing note and vitals reviewed. Constitutional: She is oriented to person, place, and time. She appears  well-developed and well-nourished.  Cardiovascular: Normal rate.  Respiratory: Effort normal. No respiratory distress.  GI: Soft. She exhibits no distension. There is no abdominal tenderness. There is no rebound, no guarding and no CVA tenderness.  Genitourinary:    Vaginal discharge present.     Genitourinary Comments: Small amount white discharge throughout vault. Cervix visually closed, confirmed with bimanual. Negative CMT   Neurological: She is alert and oriented to person, place, and time.  Skin: Skin is warm and dry.  Psychiatric: She has a normal mood and affect. Her behavior is normal. Judgment and thought content normal.    MAU Course  Procedures: sterile speculum exam  -Patient denies pain throughout time in MAU. - Normal wet prep and UA. Discussed increasing PO hydration, avoiding tight clothing, reviewing hygiene regimen and avoiding perfumes, detergents.  Patient Vitals for the past 24 hrs:  BP Temp Temp src Pulse Resp SpO2 Height Weight  05/22/19 2015 120/67 - - (!) 104 16 - - -  05/22/19 1909 (!) 120/58 98.6 F (37 C) Oral 95 18 100 % - -  05/22/19 1904 - - - - - - 5\' 5"  (1.651 m) 92.1 kg   Results for orders placed or performed during the hospital encounter of 05/22/19 (from the past 24 hour(s))  Wet prep, genital     Status: Abnormal   Collection Time: 05/22/19  7:31 PM  Result Value Ref Range   Yeast Wet Prep HPF POC NONE SEEN NONE SEEN   Trich, Wet Prep NONE SEEN NONE SEEN   Clue Cells Wet Prep HPF POC NONE SEEN NONE SEEN   WBC, Wet Prep HPF POC MODERATE (A) NONE SEEN   Sperm NONE SEEN   Urinalysis, Routine w reflex microscopic     Status: None   Collection Time: 05/22/19  7:33 PM  Result Value Ref Range   Color, Urine YELLOW YELLOW   APPearance CLEAR CLEAR   Specific Gravity, Urine 1.016 1.005 - 1.030   pH 7.0 5.0 - 8.0   Glucose, UA NEGATIVE NEGATIVE mg/dL   Hgb urine dipstick NEGATIVE NEGATIVE   Bilirubin Urine NEGATIVE NEGATIVE   Ketones, ur  NEGATIVE NEGATIVE mg/dL   Protein, ur NEGATIVE NEGATIVE mg/dL   Nitrite NEGATIVE NEGATIVE   Leukocytes,Ua NEGATIVE NEGATIVE   Assessment and Plan  --23 y.o. G2P1001 at [redacted]w[redacted]d  --FHT 145 by Doppler --No pain at any time in MAU --[facility] pending --Discharge home in stable condition  MISTY FOUTZ, CNM 05/22/2019, 8:42 PM

## 2019-05-22 NOTE — Discharge Instructions (Signed)

## 2019-05-25 LAB — GC/CHLAMYDIA PROBE AMP (~~LOC~~) NOT AT ARMC
Chlamydia: NEGATIVE
Neisseria Gonorrhea: NEGATIVE

## 2019-05-27 ENCOUNTER — Other Ambulatory Visit (HOSPITAL_COMMUNITY)
Admission: RE | Admit: 2019-05-27 | Discharge: 2019-05-27 | Disposition: A | Discharge status: Home / Self Care | Payer: Medicaid Other | Source: Ambulatory Visit | Attending: Obstetrics and Gynecology | Admitting: Obstetrics and Gynecology

## 2019-05-27 ENCOUNTER — Other Ambulatory Visit: Payer: Self-pay

## 2019-05-27 ENCOUNTER — Encounter: Payer: Self-pay | Admitting: Obstetrics and Gynecology

## 2019-05-27 ENCOUNTER — Ambulatory Visit (INDEPENDENT_AMBULATORY_CARE_PROVIDER_SITE_OTHER): Payer: Medicaid Other | Admitting: Obstetrics and Gynecology

## 2019-05-27 VITALS — BP 121/78 | HR 87 | Wt 199.0 lb

## 2019-05-27 DIAGNOSIS — Z6791 Unspecified blood type, Rh negative: Secondary | ICD-10-CM

## 2019-05-27 DIAGNOSIS — O26892 Other specified pregnancy related conditions, second trimester: Secondary | ICD-10-CM

## 2019-05-27 DIAGNOSIS — Z3480 Encounter for supervision of other normal pregnancy, unspecified trimester: Secondary | ICD-10-CM | POA: Insufficient documentation

## 2019-05-27 DIAGNOSIS — O99212 Obesity complicating pregnancy, second trimester: Secondary | ICD-10-CM

## 2019-05-27 DIAGNOSIS — O0932 Supervision of pregnancy with insufficient antenatal care, second trimester: Secondary | ICD-10-CM

## 2019-05-27 DIAGNOSIS — O99210 Obesity complicating pregnancy, unspecified trimester: Secondary | ICD-10-CM

## 2019-05-27 DIAGNOSIS — Z3A20 20 weeks gestation of pregnancy: Secondary | ICD-10-CM

## 2019-05-27 DIAGNOSIS — O0930 Supervision of pregnancy with insufficient antenatal care, unspecified trimester: Secondary | ICD-10-CM | POA: Insufficient documentation

## 2019-05-27 MED ORDER — ASPIRIN EC 81 MG PO TBEC: 81.0000 mg | DELAYED_RELEASE_TABLET | Freq: Every day | ORAL | 2 refills | Status: DC

## 2019-05-27 MED ORDER — VITAFOL ULTRA 29-0.6-0.4-200 MG PO CAPS: 1.0000 | ORAL_CAPSULE | Freq: Every day | ORAL | 12 refills | Status: DC

## 2019-05-27 NOTE — Progress Notes (Signed)
   Subjective:    Christina Willis is a G2P1001 [redacted]w[redacted]d being seen today for her first obstetrical visit.  Her obstetrical history is significant for obesity and late onset to care at [redacted]w[redacted]d. Patient does not intend to breast feed. Pregnancy history fully reviewed.  Patient reports no complaints.  Vitals:   05/27/19 1009  BP: 121/78  Pulse: 87  Weight: 199 lb (90.3 kg)    HISTORY: OB History  Gravida Para Term Preterm AB Living  2 1 1     1   SAB TAB Ectopic Multiple Live Births          1    # Outcome Date GA Lbr Len/2nd Weight Sex Delivery Anes PTL Lv  2 Current           1 Term 2017     Vag-Spont   LIV   Past Medical History:  Diagnosis Date  . Asthma    Past Surgical History:  Procedure Laterality Date  . NO PAST SURGERIES     Family History  Problem Relation Age of Onset  . Cancer Neg Hx   . Diabetes Neg Hx   . Hypertension Neg Hx      Exam    Uterus:  Fundal Height: 20 cm  Pelvic Exam:    Perineum: Normal Perineum   Vulva: normal   Vagina:  normal mucosa, normal discharge   pH:    Cervix: multiparous appearance and cervix is closed and long   Adnexa: not evaluated   Bony Pelvis: gynecoid  System: Breast:  normal appearance, no masses or tenderness   Skin: normal coloration and turgor, no rashes    Neurologic: oriented, no focal deficits   Extremities: normal strength, tone, and muscle mass   HEENT extra ocular movement intact   Mouth/Teeth mucous membranes moist, pharynx normal without lesions and dental hygiene good   Neck supple and no masses   Cardiovascular: regular rate and rhythm   Respiratory:  appears well, vitals normal, no respiratory distress, acyanotic, normal RR   Abdomen: soft, gravid   Urinary:       Assessment:    Pregnancy: G2P1001 Patient Active Problem List   Diagnosis Date Noted  . Insufficient prenatal care 05/27/2019  . Maternal obesity syndrome, antepartum 05/27/2019  . Supervision of other normal pregnancy,  antepartum 03/16/2019  . Supervision of normal pregnancy 04/04/2016  . Rh negative, antepartum 04/04/2016        Plan:     Initial labs drawn. Prenatal vitamins. Problem list reviewed and updated. Rx ASA ordered Discussed excessive weight gain thus far and to consume a healthier diet Genetic Screening discussed : panorama ordered.  Ultrasound discussed; fetal survey: ordered.  Follow up in 4 weeks. 50% of 30 min visit spent on counseling and coordination of care.     Ambrosio Reuter 05/27/2019

## 2019-05-27 NOTE — Patient Instructions (Signed)
° °Second Trimester of Pregnancy °The second trimester is from week 14 through week 27 (months 4 through 6). The second trimester is often a time when you feel your best. Your body has adjusted to being pregnant, and you begin to feel better physically. Usually, morning sickness has lessened or quit completely, you may have more energy, and you may have an increase in appetite. The second trimester is also a time when the fetus is growing rapidly. At the end of the sixth month, the fetus is about 9 inches long and weighs about 1½ pounds. You will likely begin to feel the baby move (quickening) between 16 and 20 weeks of pregnancy. °Body changes during your second trimester °Your body continues to go through many changes during your second trimester. The changes vary from woman to woman. °· Your weight will continue to increase. You will notice your lower abdomen bulging out. °· You may begin to get stretch marks on your hips, abdomen, and breasts. °· You may develop headaches that can be relieved by medicines. The medicines should be approved by your health care provider. °· You may urinate more often because the fetus is pressing on your bladder. °· You may develop or continue to have heartburn as a result of your pregnancy. °· You may develop constipation because certain hormones are causing the muscles that push waste through your intestines to slow down. °· You may develop hemorrhoids or swollen, bulging veins (varicose veins). °· You may have back pain. This is caused by: °? Weight gain. °? Pregnancy hormones that are relaxing the joints in your pelvis. °? A shift in weight and the muscles that support your balance. °· Your breasts will continue to grow and they will continue to become tender. °· Your gums may bleed and may be sensitive to brushing and flossing. °· Dark spots or blotches (chloasma, mask of pregnancy) may develop on your face. This will likely fade after the baby is born. °· A dark line from  your belly button to the pubic area (linea nigra) may appear. This will likely fade after the baby is born. °· You may have changes in your hair. These can include thickening of your hair, rapid growth, and changes in texture. Some women also have hair loss during or after pregnancy, or hair that feels dry or thin. Your hair will most likely return to normal after your baby is born. °What to expect at prenatal visits °During a routine prenatal visit: °· You will be weighed to make sure you and the fetus are growing normally. °· Your blood pressure will be taken. °· Your abdomen will be measured to track your baby's growth. °· The fetal heartbeat will be listened to. °· Any test results from the previous visit will be discussed. °Your health care provider may ask you: °· How you are feeling. °· If you are feeling the baby move. °· If you have had any abnormal symptoms, such as leaking fluid, bleeding, severe headaches, or abdominal cramping. °· If you are using any tobacco products, including cigarettes, chewing tobacco, and electronic cigarettes. °· If you have any questions. °Other tests that may be performed during your second trimester include: °· Blood tests that check for: °? Low iron levels (anemia). °? High blood sugar that affects pregnant women (gestational diabetes) between 24 and 28 weeks. °? Rh antibodies. This is to check for a protein on red blood cells (Rh factor). °· Urine tests to check for infections, diabetes, or protein in   the urine. °· An ultrasound to confirm the proper growth and development of the baby. °· An amniocentesis to check for possible genetic problems. °· Fetal screens for spina bifida and Down syndrome. °· HIV (human immunodeficiency virus) testing. Routine prenatal testing includes screening for HIV, unless you choose not to have this test. °Follow these instructions at home: °Medicines °· Follow your health care provider's instructions regarding medicine use. Specific medicines  may be either safe or unsafe to take during pregnancy. °· Take a prenatal vitamin that contains at least 600 micrograms (mcg) of folic acid. °· If you develop constipation, try taking a stool softener if your health care provider approves. °Eating and drinking ° °· Eat a balanced diet that includes fresh fruits and vegetables, whole grains, good sources of protein such as meat, eggs, or tofu, and low-fat dairy. Your health care provider will help you determine the amount of weight gain that is right for you. °· Avoid raw meat and uncooked cheese. These carry germs that can cause birth defects in the baby. °· If you have low calcium intake from food, talk to your health care provider about whether you should take a daily calcium supplement. °· Limit foods that are high in fat and processed sugars, such as fried and sweet foods. °· To prevent constipation: °? Drink enough fluid to keep your urine clear or pale yellow. °? Eat foods that are high in fiber, such as fresh fruits and vegetables, whole grains, and beans. °Activity °· Exercise only as directed by your health care provider. Most women can continue their usual exercise routine during pregnancy. Try to exercise for 30 minutes at least 5 days a week. Stop exercising if you experience uterine contractions. °· Avoid heavy lifting, wear low heel shoes, and practice good posture. °· A sexual relationship may be continued unless your health care provider directs you otherwise. °Relieving pain and discomfort °· Wear a good support bra to prevent discomfort from breast tenderness. °· Take warm sitz baths to soothe any pain or discomfort caused by hemorrhoids. Use hemorrhoid cream if your health care provider approves. °· Rest with your legs elevated if you have leg cramps or low back pain. °· If you develop varicose veins, wear support hose. Elevate your feet for 15 minutes, 3-4 times a day. Limit salt in your diet. °Prenatal Care °· Write down your questions. Take  them to your prenatal visits. °· Keep all your prenatal visits as told by your health care provider. This is important. °Safety °· Wear your seat belt at all times when driving. °· Make a list of emergency phone numbers, including numbers for family, friends, the hospital, and police and fire departments. °General instructions °· Ask your health care provider for a referral to a local prenatal education class. Begin classes no later than the beginning of month 6 of your pregnancy. °· Ask for help if you have counseling or nutritional needs during pregnancy. Your health care provider can offer advice or refer you to specialists for help with various needs. °· Do not use hot tubs, steam rooms, or saunas. °· Do not douche or use tampons or scented sanitary pads. °· Do not cross your legs for long periods of time. °· Avoid cat litter boxes and soil used by cats. These carry germs that can cause birth defects in the baby and possibly loss of the fetus by miscarriage or stillbirth. °· Avoid all smoking, herbs, alcohol, and unprescribed drugs. Chemicals in these products can affect the   formation and growth of the baby.  Do not use any products that contain nicotine or tobacco, such as cigarettes and e-cigarettes. If you need help quitting, ask your health care provider.  Visit your dentist if you have not gone yet during your pregnancy. Use a soft toothbrush to brush your teeth and be gentle when you floss. Contact a health care provider if:  You have dizziness.  You have mild pelvic cramps, pelvic pressure, or nagging pain in the abdominal area.  You have persistent nausea, vomiting, or diarrhea.  You have a bad smelling vaginal discharge.  You have pain when you urinate. Get help right away if:  You have a fever.  You are leaking fluid from your vagina.  You have spotting or bleeding from your vagina.  You have severe abdominal cramping or pain.  You have rapid weight gain or weight loss.  You  have shortness of breath with chest pain.  You notice sudden or extreme swelling of your face, hands, ankles, feet, or legs.  You have not felt your baby move in over an hour.  You have severe headaches that do not go away when you take medicine.  You have vision changes. Summary  The second trimester is from week 14 through week 27 (months 4 through 6). It is also a time when the fetus is growing rapidly.  Your body goes through many changes during pregnancy. The changes vary from woman to woman.  Avoid all smoking, herbs, alcohol, and unprescribed drugs. These chemicals affect the formation and growth your baby.  Do not use any tobacco products, such as cigarettes, chewing tobacco, and e-cigarettes. If you need help quitting, ask your health care provider.  Contact your health care provider if you have any questions. Keep all prenatal visits as told by your health care provider. This is important. This information is not intended to replace advice given to you by your health care provider. Make sure you discuss any questions you have with your health care provider. Document Released: 09/03/2001 Document Revised: 01/01/2019 Document Reviewed: 10/15/2016 Elsevier Patient Education  2020 [employer].   Contraception Choices Contraception, also called birth control, refers to methods or devices that prevent pregnancy. Hormonal methods Contraceptive implant  A contraceptive implant is a thin, plastic tube that contains a hormone. It is inserted into the upper part of the arm. It can remain in place for up to 3 years. Progestin-only injections Progestin-only injections are injections of progestin, a synthetic form of the hormone progesterone. They are given every 3 months by a health care provider. Birth control pills  Birth control pills are pills that contain hormones that prevent pregnancy. They must be taken once a day, preferably at the same time each day. Birth control  patch  The birth control patch contains hormones that prevent pregnancy. It is placed on the skin and must be changed once a week for three weeks and removed on the fourth week. A prescription is needed to use this method of contraception. Vaginal ring  A vaginal ring contains hormones that prevent pregnancy. It is placed in the vagina for three weeks and removed on the fourth week. After that, the process is repeated with a new ring. A prescription is needed to use this method of contraception. Emergency contraceptive Emergency contraceptives prevent pregnancy after unprotected sex. They come in pill form and can be taken up to 5 days after sex. They work best the sooner they are taken after having sex. Most emergency  contraceptives are available without a prescription. This method should not be used as your only form of birth control. Barrier methods Female condom  A female condom is a thin sheath that is worn over the penis during sex. Condoms keep sperm from going inside a woman's body. They can be used with a spermicide to increase their effectiveness. They should be disposed after a single use. Female condom  A female condom is a soft, loose-fitting sheath that is put into the vagina before sex. The condom keeps sperm from going inside a woman's body. They should be disposed after a single use. Diaphragm  A diaphragm is a soft, dome-shaped barrier. It is inserted into the vagina before sex, along with a spermicide. The diaphragm blocks sperm from entering the uterus, and the spermicide kills sperm. A diaphragm should be left in the vagina for 6-8 hours after sex and removed within 24 hours. A diaphragm is prescribed and fitted by a health care provider. A diaphragm should be replaced every 1-2 years, after giving birth, after gaining more than 15 lb (6.8 kg), and after pelvic surgery. Cervical cap  A cervical cap is a round, soft latex or plastic cup that fits over the cervix. It is inserted  into the vagina before sex, along with spermicide. It blocks sperm from entering the uterus. The cap should be left in place for 6-8 hours after sex and removed within 48 hours. A cervical cap must be prescribed and fitted by a health care provider. It should be replaced every 2 years. Sponge  A sponge is a soft, circular piece of polyurethane foam with spermicide on it. The sponge helps block sperm from entering the uterus, and the spermicide kills sperm. To use it, you make it wet and then insert it into the vagina. It should be inserted before sex, left in for at least 6 hours after sex, and removed and thrown away within 30 hours. Spermicides Spermicides are chemicals that kill or block sperm from entering the cervix and uterus. They can come as a cream, jelly, suppository, foam, or tablet. A spermicide should be inserted into the vagina with an applicator at least 10-15 minutes before sex to allow time for it to work. The process must be repeated every time you have sex. Spermicides do not require a prescription. Intrauterine contraception Intrauterine device (IUD) An IUD is a T-shaped device that is put in a woman's uterus. There are two types:  Hormone IUD.This type contains progestin, a synthetic form of the hormone progesterone. This type can stay in place for 3-5 years.  Copper IUD.This type is wrapped in copper wire. It can stay in place for 10 years.  Permanent methods of contraception Female tubal ligation In this method, a woman's fallopian tubes are sealed, tied, or blocked during surgery to prevent eggs from traveling to the uterus. Hysteroscopic sterilization In this method, a small, flexible insert is placed into each fallopian tube. The inserts cause scar tissue to form in the fallopian tubes and block them, so sperm cannot reach an egg. The procedure takes about 3 months to be effective. Another form of birth control must be used during those 3 months. Female sterilization This  is a procedure to tie off the tubes that carry sperm (vasectomy). After the procedure, the man can still ejaculate fluid (semen). Natural planning methods Natural family planning In this method, a couple does not have sex on days when the woman could become pregnant. Calendar method This means keeping track  of the length of each menstrual cycle, identifying the days when pregnancy can happen, and not having sex on those days. °Ovulation method °In this method, a couple avoids sex during ovulation. °Symptothermal method °This method involves not having sex during ovulation. The woman typically checks for ovulation by watching changes in her temperature and in the consistency of cervical mucus. °Post-ovulation method °In this method, a couple waits to have sex until after ovulation. °Summary °· Contraception, also called birth control, means methods or devices that prevent pregnancy. °· Hormonal methods of contraception include implants, injections, pills, patches, vaginal rings, and emergency contraceptives. °· Barrier methods of contraception can include female condoms, female condoms, diaphragms, cervical caps, sponges, and spermicides. °· There are two types of IUDs (intrauterine devices). An IUD can be put in a woman's uterus to prevent pregnancy for 3-5 years. °· Permanent sterilization can be done through a procedure for males, females, or both. °· Natural family planning methods involve not having sex on days when the woman could become pregnant. °This information is not intended to replace advice given to you by your health care provider. Make sure you discuss any questions you have with your health care provider. °Document Released: 09/09/2005 Document Revised: 09/11/2017 Document Reviewed: 10/12/2016 °Elsevier Patient Education © 2020 [employer] ° ° °Breastfeeding ° °Choosing to breastfeed is one of the best decisions you can make for yourself and your baby. A change in hormones during pregnancy  causes your breasts to make breast milk in your milk-producing glands. Hormones prevent breast milk from being released before your baby is born. They also prompt milk flow after birth. Once breastfeeding has begun, thoughts of your baby, as well as his or her sucking or crying, can stimulate the release of milk from your milk-producing glands. °Benefits of breastfeeding °Research shows that breastfeeding offers many health benefits for infants and mothers. It also offers a cost-free and convenient way to feed your baby. °For your baby °· Your first milk (colostrum) helps your baby's digestive system to function better. °· Special cells in your milk (antibodies) help your baby to fight off infections. °· Breastfed babies are less likely to develop asthma, allergies, obesity, or type 2 diabetes. They are also at lower risk for sudden infant death syndrome (SIDS). °· Nutrients in breast milk are better able to meet your baby’s needs compared to infant formula. °· Breast milk improves your baby's brain development. °For you °· Breastfeeding helps to create a very special bond between you and your baby. °· Breastfeeding is convenient. Breast milk costs nothing and is always available at the correct temperature. °· Breastfeeding helps to burn calories. It helps you to lose the weight that you gained during pregnancy. °· Breastfeeding makes your uterus return faster to its size before pregnancy. It also slows bleeding (lochia) after you give birth. °· Breastfeeding helps to lower your risk of developing type 2 diabetes, osteoporosis, rheumatoid arthritis, cardiovascular disease, and breast, ovarian, uterine, and endometrial cancer later in life. °Breastfeeding basics °Starting breastfeeding °· Find a comfortable place to sit or lie down, with your neck and back well-supported. °· Place a pillow or a rolled-up blanket under your baby to bring him or her to the level of your breast (if you are seated). Nursing pillows are  specially designed to help support your arms and your baby while you breastfeed. °· Make sure that your baby's tummy (abdomen) is facing your abdomen. °· Gently massage your breast. With your fingertips, massage from the outer   edges of your breast inward toward the nipple. This encourages milk flow. If your milk flows slowly, you may need to continue this action during the feeding.  Support your breast with 4 fingers underneath and your thumb above your nipple (make the letter "C" with your hand). Make sure your fingers are well away from your nipple and your babys mouth.  Stroke your baby's lips gently with your finger or nipple.  When your baby's mouth is open wide enough, quickly bring your baby to your breast, placing your entire nipple and as much of the areola as possible into your baby's mouth. The areola is the colored area around your nipple. ? More areola should be visible above your baby's upper lip than below the lower lip. ? Your baby's lips should be opened and extended outward (flanged) to ensure an adequate, comfortable latch. ? Your baby's tongue should be between his or her lower gum and your breast.  Make sure that your baby's mouth is correctly positioned around your nipple (latched). Your baby's lips should create a seal on your breast and be turned out (everted).  It is common for your baby to suck about 2-3 minutes in order to start the flow of breast milk. Latching Teaching your baby how to latch onto your breast properly is very important. An improper latch can cause nipple pain, decreased milk supply, and poor weight gain in your baby. Also, if your baby is not latched onto your nipple properly, he or she may swallow some air during feeding. This can make your baby fussy. Burping your baby when you switch breasts during the feeding can help to get rid of the air. However, teaching your baby to latch on properly is still the best way to prevent fussiness from swallowing air  while breastfeeding. Signs that your baby has successfully latched onto your nipple  Silent tugging or silent sucking, without causing you pain. Infant's lips should be extended outward (flanged).  Swallowing heard between every 3-4 sucks once your milk has started to flow (after your let-down milk reflex occurs).  Muscle movement above and in front of his or her ears while sucking. Signs that your baby has not successfully latched onto your nipple  Sucking sounds or smacking sounds from your baby while breastfeeding.  Nipple pain. If you think your baby has not latched on correctly, slip your finger into the corner of your babys mouth to break the suction and place it between your baby's gums. Attempt to start breastfeeding again. Signs of successful breastfeeding Signs from your baby  Your baby will gradually decrease the number of sucks or will completely stop sucking.  Your baby will fall asleep.  Your baby's body will relax.  Your baby will retain a small amount of milk in his or her mouth.  Your baby will let go of your breast by himself or herself. Signs from you  Breasts that have increased in firmness, weight, and size 1-3 hours after feeding.  Breasts that are softer immediately after breastfeeding.  Increased milk volume, as well as a change in milk consistency and color by the fifth day of breastfeeding.  Nipples that are not sore, cracked, or bleeding. Signs that your baby is getting enough milk  Wetting at least 1-2 diapers during the first 24 hours after birth.  Wetting at least 5-6 diapers every 24 hours for the first week after birth. The urine should be clear or pale yellow by the age of 5 days.  Wetting  6-8 diapers every 24 hours as your baby continues to grow and develop.  At least 3 stools in a 24-hour period by the age of 5 days. The stool should be soft and yellow.  At least 3 stools in a 24-hour period by the age of 7 days. The stool should be  seedy and yellow.  No loss of weight greater than 10% of birth weight during the first 3 days of life.  Average weight gain of 4-7 oz (113-198 g) per week after the age of 4 days.  Consistent daily weight gain by the age of 5 days, without weight loss after the age of 2 weeks. After a feeding, your baby may spit up a small amount of milk. This is normal. Breastfeeding frequency and duration Frequent feeding will help you make more milk and can prevent sore nipples and extremely full breasts (breast engorgement). Breastfeed when you feel the need to reduce the fullness of your breasts or when your baby shows signs of hunger. This is called "breastfeeding on demand." Signs that your baby is hungry include:  Increased alertness, activity, or restlessness.  Movement of the head from side to side.  Opening of the mouth when the corner of the mouth or cheek is stroked (rooting).  Increased sucking sounds, smacking lips, cooing, sighing, or squeaking.  Hand-to-mouth movements and sucking on fingers or hands.  Fussing or crying. Avoid introducing a pacifier to your baby in the first 4-6 weeks after your baby is born. After this time, you may choose to use a pacifier. Research has shown that pacifier use during the first year of a baby's life decreases the risk of sudden infant death syndrome (SIDS). Allow your baby to feed on each breast as long as he or she wants. When your baby unlatches or falls asleep while feeding from the first breast, offer the second breast. Because newborns are often sleepy in the first few weeks of life, you may need to awaken your baby to get him or her to feed. Breastfeeding times will vary from baby to baby. However, the following rules can serve as a guide to help you make sure that your baby is properly fed:  Newborns (babies 82 weeks of age or younger) may breastfeed every 1-3 hours.  Newborns should not go without breastfeeding for longer than 3 hours during the  day or 5 hours during the night.  You should breastfeed your baby a minimum of 8 times in a 24-hour period. Breast milk pumping     Pumping and storing breast milk allows you to make sure that your baby is exclusively fed your breast milk, even at times when you are unable to breastfeed. This is especially important if you go back to work while you are still breastfeeding, or if you are not able to be present during feedings. Your lactation consultant can help you find a method of pumping that works best for you and give you guidelines about how long it is safe to store breast milk. Caring for your breasts while you breastfeed Nipples can become dry, cracked, and sore while breastfeeding. The following recommendations can help keep your breasts moisturized and healthy:  Avoid using soap on your nipples.  Wear a supportive bra designed especially for nursing. Avoid wearing underwire-style bras or extremely tight bras (sports bras).  Air-dry your nipples for 3-4 minutes after each feeding.  Use only cotton bra pads to absorb leaked breast milk. Leaking of breast milk between feedings is normal.  Use lanolin on your nipples after breastfeeding. Lanolin helps to maintain your skin's normal moisture barrier. Pure lanolin is not harmful (not toxic) to your baby. You may also hand express a few drops of breast milk and gently massage that milk into your nipples and allow the milk to air-dry. In the first few weeks after giving birth, some women experience breast engorgement. Engorgement can make your breasts feel heavy, warm, and tender to the touch. Engorgement peaks within 3-5 days after you give birth. The following recommendations can help to ease engorgement:  Completely empty your breasts while breastfeeding or pumping. You may want to start by applying warm, moist heat (in the shower or with warm, water-soaked hand towels) just before feeding or pumping. This increases circulation and helps the  milk flow. If your baby does not completely empty your breasts while breastfeeding, pump any extra milk after he or she is finished.  Apply ice packs to your breasts immediately after breastfeeding or pumping, unless this is too uncomfortable for you. To do this: ? Put ice in a plastic bag. ? Place a towel between your skin and the bag. ? Leave the ice on for 20 minutes, 2-3 times a day.  Make sure that your baby is latched on and positioned properly while breastfeeding. If engorgement persists after 48 hours of following these recommendations, contact your health care provider or a Advertising copywriter. Overall health care recommendations while breastfeeding  Eat 3 healthy meals and 3 snacks every day. Well-nourished mothers who are breastfeeding need an additional 450-500 calories a day. You can meet this requirement by increasing the amount of a balanced diet that you eat.  Drink enough water to keep your urine pale yellow or clear.  Rest often, relax, and continue to take your prenatal vitamins to prevent fatigue, stress, and low vitamin and mineral levels in your body (nutrient deficiencies).  Do not use any products that contain nicotine or tobacco, such as cigarettes and e-cigarettes. Your baby may be harmed by chemicals from cigarettes that pass into breast milk and exposure to secondhand smoke. If you need help quitting, ask your health care provider.  Avoid alcohol.  Do not use illegal drugs or marijuana.  Talk with your health care provider before taking any medicines. These include over-the-counter and prescription medicines as well as vitamins and herbal supplements. Some medicines that may be harmful to your baby can pass through breast milk.  It is possible to become pregnant while breastfeeding. If birth control is desired, ask your health care provider about options that will be safe while breastfeeding your baby. Where to find more information: [employer]  International: www.llli.org Contact a health care provider if:  You feel like you want to stop breastfeeding or have become frustrated with breastfeeding.  Your nipples are cracked or bleeding.  Your breasts are red, tender, or warm.  You have: ? Painful breasts or nipples. ? A swollen area on either breast. ? A fever or chills. ? Nausea or vomiting. ? Drainage other than breast milk from your nipples.  Your breasts do not become full before feedings by the fifth day after you give birth.  You feel sad and depressed.  Your baby is: ? Too sleepy to eat well. ? Having trouble sleeping. ? More than 19 week old and wetting fewer than 6 diapers in a 24-hour period. ? Not gaining weight by 42 days of age.  Your baby has fewer than 3 stools in a 24-hour period.  Your baby's skin or the white parts of his or her eyes become yellow. Get help right away if:  Your baby is overly tired (lethargic) and does not want to wake up and feed.  Your baby develops an unexplained fever. Summary  Breastfeeding offers many health benefits for infant and mothers.  Try to breastfeed your infant when he or she shows early signs of hunger.  Gently tickle or stroke your baby's lips with your finger or nipple to allow the baby to open his or her mouth. Bring the baby to your breast. Make sure that much of the areola is in your baby's mouth. Offer one side and burp the baby before you offer the other side.  Talk with your health care provider or lactation consultant if you have questions or you face problems as you breastfeed. This information is not intended to replace advice given to you by your health care provider. Make sure you discuss any questions you have with your health care provider. Document Released: 09/09/2005 Document Revised: 12/04/2017 Document Reviewed: 10/11/2016 Elsevier Patient Education  2020 [employer].

## 2019-05-28 LAB — CYTOLOGY - PAP: Diagnosis: NEGATIVE

## 2019-05-30 LAB — OBSTETRIC PANEL, INCLUDING HIV
Basophils Absolute: 0 10*3/uL (ref 0.0–0.2)
Basos: 0 %
EOS (ABSOLUTE): 0.3 10*3/uL (ref 0.0–0.4)
Eos: 3 %
HIV Screen 4th Generation wRfx: NONREACTIVE
Hematocrit: 33.7 % — ABNORMAL LOW (ref 34.0–46.6)
Hemoglobin: 11.3 g/dL (ref 11.1–15.9)
Hepatitis B Surface Ag: NEGATIVE
Immature Grans (Abs): 0 10*3/uL (ref 0.0–0.1)
Immature Granulocytes: 0 %
Lymphocytes Absolute: 2 10*3/uL (ref 0.7–3.1)
Lymphs: 24 %
MCH: 28.8 pg (ref 26.6–33.0)
MCHC: 33.5 g/dL (ref 31.5–35.7)
MCV: 86 fL (ref 79–97)
Monocytes Absolute: 0.5 10*3/uL (ref 0.1–0.9)
Monocytes: 6 %
Neutrophils Absolute: 5.4 10*3/uL (ref 1.4–7.0)
Neutrophils: 67 %
Platelets: 306 10*3/uL (ref 150–450)
RBC: 3.93 x10E6/uL (ref 3.77–5.28)
RDW: 12.5 % (ref 11.7–15.4)
RPR Ser Ql: NONREACTIVE
Rh Factor: NEGATIVE
Rubella Antibodies, IGG: 6.11 index (ref >0.99)
WBC: 8.1 10*3/uL (ref 3.4–10.8)

## 2019-05-30 LAB — AFP TETRA
DIA Mom Value: 0.91
DIA Value (EIA): 157.9 pg/mL
DSR (By Age)    1 IN: 1106
DSR (Second Trimester) 1 IN: 10000
Gestational Age: 20.5 WEEKS
MSAFP Mom: 1.26
MSAFP: 68.8 ng/mL
MSHCG Mom: 0.28
MSHCG: 5697 m[IU]/mL
Maternal Age At EDD: 22.9 yr
Osb Risk: 10000
T18 (By Age): 1:4307 {titer}
Test Results:: NEGATIVE
Weight: 199 [lb_av]
uE3 Mom: 1.05
uE3 Value: 2.33 ng/mL

## 2019-05-30 LAB — AB SCR+ANTIBODY ID: Antibody Screen: POSITIVE — AB

## 2019-05-30 LAB — HEMOGLOBIN A1C
Est. average glucose Bld gHb Est-mCnc: 103 mg/dL
Hgb A1c MFr Bld: 5.2 % (ref 4.8–5.6)

## 2019-06-04 ENCOUNTER — Encounter: Payer: Self-pay | Admitting: Obstetrics and Gynecology

## 2019-06-08 ENCOUNTER — Ambulatory Visit (HOSPITAL_COMMUNITY): Payer: Medicaid Other

## 2019-06-14 ENCOUNTER — Encounter: Payer: Self-pay | Admitting: Obstetrics & Gynecology

## 2019-06-14 ENCOUNTER — Encounter: Payer: Self-pay | Admitting: Obstetrics and Gynecology

## 2019-06-14 DIAGNOSIS — D563 Thalassemia minor: Secondary | ICD-10-CM

## 2019-06-14 HISTORY — DX: Thalassemia minor: D56.3

## 2019-06-15 ENCOUNTER — Other Ambulatory Visit: Payer: Self-pay

## 2019-06-15 ENCOUNTER — Ambulatory Visit (HOSPITAL_COMMUNITY)
Admission: RE | Admit: 2019-06-15 | Discharge: 2019-06-15 | Disposition: A | Discharge status: Home / Self Care | Payer: Medicaid Other | Source: Ambulatory Visit | Attending: Obstetrics and Gynecology | Admitting: Obstetrics and Gynecology

## 2019-06-15 DIAGNOSIS — Z3480 Encounter for supervision of other normal pregnancy, unspecified trimester: Secondary | ICD-10-CM | POA: Insufficient documentation

## 2019-06-15 DIAGNOSIS — Z363 Encounter for antenatal screening for malformations: Secondary | ICD-10-CM | POA: Diagnosis not present

## 2019-06-15 DIAGNOSIS — Z148 Genetic carrier of other disease: Secondary | ICD-10-CM

## 2019-06-15 DIAGNOSIS — O0932 Supervision of pregnancy with insufficient antenatal care, second trimester: Secondary | ICD-10-CM | POA: Diagnosis not present

## 2019-06-15 DIAGNOSIS — Z3A23 23 weeks gestation of pregnancy: Secondary | ICD-10-CM

## 2019-06-15 DIAGNOSIS — O99212 Obesity complicating pregnancy, second trimester: Secondary | ICD-10-CM | POA: Diagnosis not present

## 2019-06-16 ENCOUNTER — Encounter: Payer: Medicaid Other | Admitting: Advanced Practice Midwife

## 2019-06-16 ENCOUNTER — Other Ambulatory Visit (HOSPITAL_COMMUNITY): Payer: Self-pay | Admitting: *Deleted

## 2019-06-16 DIAGNOSIS — Z362 Encounter for other antenatal screening follow-up: Secondary | ICD-10-CM

## 2019-06-21 ENCOUNTER — Telehealth: Payer: Self-pay | Admitting: *Deleted

## 2019-06-21 NOTE — Telephone Encounter (Signed)
-----   Message from Osborne Oman, MD sent at 06/14/2019  1:09 PM EDT ----- Alpha thalassemia silent carrier noted on hemoglobin evaluation in [facility] test FOB testing and Genetic counseling recommended Please call to inform patient of results and recommendations.

## 2019-06-24 ENCOUNTER — Encounter: Payer: Self-pay | Admitting: Obstetrics

## 2019-06-24 ENCOUNTER — Telehealth (INDEPENDENT_AMBULATORY_CARE_PROVIDER_SITE_OTHER): Payer: Medicaid Other | Admitting: Obstetrics

## 2019-06-24 DIAGNOSIS — Z3480 Encounter for supervision of other normal pregnancy, unspecified trimester: Secondary | ICD-10-CM

## 2019-06-24 DIAGNOSIS — O26899 Other specified pregnancy related conditions, unspecified trimester: Secondary | ICD-10-CM

## 2019-06-24 DIAGNOSIS — O99212 Obesity complicating pregnancy, second trimester: Secondary | ICD-10-CM | POA: Diagnosis not present

## 2019-06-24 DIAGNOSIS — O99612 Diseases of the digestive system complicating pregnancy, second trimester: Secondary | ICD-10-CM

## 2019-06-24 DIAGNOSIS — O360120 Maternal care for anti-D [Rh] antibodies, second trimester, not applicable or unspecified: Secondary | ICD-10-CM

## 2019-06-24 DIAGNOSIS — Z3A24 24 weeks gestation of pregnancy: Secondary | ICD-10-CM

## 2019-06-24 DIAGNOSIS — O0932 Supervision of pregnancy with insufficient antenatal care, second trimester: Secondary | ICD-10-CM

## 2019-06-24 DIAGNOSIS — O99210 Obesity complicating pregnancy, unspecified trimester: Secondary | ICD-10-CM

## 2019-06-24 DIAGNOSIS — K219 Gastro-esophageal reflux disease without esophagitis: Secondary | ICD-10-CM

## 2019-06-24 DIAGNOSIS — Z6791 Unspecified blood type, Rh negative: Secondary | ICD-10-CM

## 2019-06-24 MED ORDER — OMEPRAZOLE 20 MG PO CPDR: 20.0000 mg | DELAYED_RELEASE_CAPSULE | Freq: Two times a day (BID) | ORAL | 5 refills | Status: DC

## 2019-06-24 MED ORDER — VITAFOL ULTRA 29-0.6-0.4-200 MG PO CAPS: 1.0000 | ORAL_CAPSULE | Freq: Every day | ORAL | 12 refills | Status: DC

## 2019-06-24 MED ORDER — ASPIRIN EC 81 MG PO TBEC: 81.0000 mg | DELAYED_RELEASE_TABLET | Freq: Every day | ORAL | 2 refills | Status: DC

## 2019-06-24 MED ORDER — BLOOD PRESSURE CUFF MISC: 1.0000 | 0 refills | Status: DC

## 2019-06-24 NOTE — Progress Notes (Signed)
TELEHEALTH OBSTETRICS PRENATAL VIRTUAL VIDEO VISIT ENCOUNTER NOTE  Provider [facility]: [facility] [place] [facility]   I connected with Christina HellerSamantha Piasecki on 06/24/19 at  3:00 PM EDT by OB MyChart Video Encounter at home and verified that I am speaking with the correct person using two identifiers.   I discussed the limitations, risks, security and privacy concerns of performing an evaluation and management service virtually and the availability of in person appointments. I also discussed with the patient that there may be a patient responsible charge related to this service. The patient expressed understanding and agreed to proceed. Subjective:  Christina HellerSamantha Schul is a 23 y.o. G2P1001 at 1829w5d being seen today for ongoing prenatal care.  She is currently monitored for the following issues for this low-risk pregnancy and has Rh negative, antepartum; Supervision of other normal pregnancy, antepartum; Insufficient prenatal care; Maternal obesity syndrome, antepartum; and Alpha thalassemia silent carrier on their problem list.  Patient reports heartburn.  Contractions: Not present. Vag. Bleeding: None.  Movement: Present. Denies any leaking of fluid.   The following portions of the patient's history were reviewed and updated as appropriate: allergies, current medications, past family history, past medical history, past social history, past surgical history and problem list.   Objective:  There were no vitals filed for this visit.  Fetal Status:     Movement: Present     General:  Alert, oriented and cooperative. Patient is in no acute distress.  Respiratory: Normal respiratory effort, no problems with respiration noted  Mental Status: Normal mood and affect. Normal behavior. Normal judgment and thought content.  Rest of physical exam deferred due to type of encounter  Imaging: [place] Mfm Ob Comp + 14 Wk  Result Date: 06/16/2019  ----------------------------------------------------------------------  OBSTETRICS REPORT                        (Signed Final 06/16/2019 10:38 am) ---------------------------------------------------------------------- Patient Info  ID #:       [id number]                          D.O.B.:  08/18/1996 (23 yrs)  Name:       Christina Willis               Visit Date: 06/15/2019 03:07 pm ---------------------------------------------------------------------- Performed By  Performed By:     Sandi MealyJovancia Adrien        Ref. Address:      Faculty                    RDMS  Attending:        Lin Landsmanorenthian Booker      [facility]  Referred By:      Gigi GinPEGGY                    CONSTANT MD ---------------------------------------------------------------------- Orders   #  Description  Code         Ordered By   1  [place] MFM OB COMP + 14 WK               [id number]     PEGGY CONSTANT  ----------------------------------------------------------------------   #  Order #                    Accession #                 Episode #   1  [id number]                  [id number]                  [id number]  ---------------------------------------------------------------------- Indications   Encounter for antenatal screening for          Z36.3   malformations (Low Risk NIPS)   Obesity complicating pregnancy, second         O99.212   trimester   Insufficient Prenatal Care                     O09.30   Genetic carrier (Silent Carrier for Alpha      Z14.8   Thalassemia)   [redacted] weeks gestation of pregnancy                Z3A.23  ---------------------------------------------------------------------- Fetal Evaluation  Num Of Fetuses:          1  Fetal Heart Rate(bpm):   154  Cardiac Activity:        Observed  Presentation:            Variable  Placenta:                Anterior  P. Cord Insertion:       Visualized  Amniotic Fluid  AFI FV:      Within  normal limits                              Largest Pocket(cm)                              4.42 ---------------------------------------------------------------------- Biometry  BPD:      56.8  mm     G. Age:  23w 3d         42  %    CI:        78.41   %    70 - 86                                                          FL/HC:       20.6  %    19.2 - 20.8  HC:      202.9  mm     G. Age:  22w 3d          7  %    HC/AC:       1.08       1.05 - 1.21  AC:      187.2  mm     G. Age:  23w 3d  43  %    FL/BPD:      73.6  %    71 - 87  FL:       41.8  mm     G. Age:  23w 4d         44  %    FL/AC:       22.3  %    20 - 24  HUM:      40.3  mm     G. Age:  24w 3d         84  %  CER:      24.4  mm     G. Age:  22w 3d         57  %  NFT:       6.9  mm  LV:        4.9  mm  CM:        5.7  mm  Est. FW:     593   gm     1 lb 5 oz     41  % ---------------------------------------------------------------------- OB History  Gravidity:    2         Term:   1  Living:       1 ---------------------------------------------------------------------- Gestational Age  LMP:           23w 3d        Date:  01/02/19                 EDD:   10/09/19  U/S Today:     23w 2d                                        EDD:   10/10/19  Best:          23w 3d     Det. By:  LMP  (01/02/19)          EDD:   10/09/19 ---------------------------------------------------------------------- Anatomy  Cranium:               Appears normal         Aortic Arch:            Not well visualized  Cavum:                 Appears normal         Ductal Arch:            Appears normal  Ventricles:            Appears normal         Diaphragm:              Appears normal  Choroid Plexus:        Appears normal         Stomach:                Appears normal, left                                                                        sided  Cerebellum:  Appears normal         Abdomen:                Appears normal  Posterior Fossa:       Appears normal          Abdominal Wall:         Appears nml (cord                                                                        insert, abd wall)  Nuchal Fold:           Appears normal         Cord Vessels:           Appears normal (3                                                                        vessel cord)  Face:                  Orbits appear          Kidneys:                Not well visualized                         normal  Lips:                  Appears normal         Bladder:                Appears normal  Thoracic:              Appears normal         Spine:                  Ltd views no                                                                        intracranial signs of                                                                        NTD  Heart:                 Not well visualized    Upper Extremities:      Appears normal  RVOT:  Not well visualized    Lower Extremities:      Appears normal  LVOT:                  Appears normal  Other:  Technically difficult due to maternal habitus and fetal position. Female          gender. ---------------------------------------------------------------------- Cervix Uterus Adnexa  Cervix  Length:           3.11  cm.  Normal appearance by transabdominal scan. ---------------------------------------------------------------------- Impression  Normal interval growth.  No ultrasonic evidence of structural  fetal anomalies.  Suboptimal views of the fetal anatomy was obtained  secondary to fetal position. ---------------------------------------------------------------------- Recommendations  Follow up anatomy in 4 weeks. ----------------------------------------------------------------------               Lin Landsman, MD Electronically Signed Final Report   06/16/2019 10:38 am ----------------------------------------------------------------------   Assessment and Plan:  Pregnancy: G2P1001 at [redacted]w[redacted]d 1. Supervision of other normal pregnancy,  antepartum Rx: - Prenat-Fe Poly-Methfol-FA-DHA (VITAFOL ULTRA) 29-0.6-0.4-200 MG CAPS; Take 1 tablet by mouth daily.  Dispense: 30 capsule; Refill: 12  2. Insufficient prenatal care in second trimester  3. Rh negative, antepartum - rhogam at 28 weeks and postpartum  4. Maternal obesity syndrome, antepartum Rx: - aspirin EC 81 MG tablet; Take 1 tablet (81 mg total) by mouth daily. Take after 12 weeks for prevention of preeclampsia later in pregnancy  Dispense: 300 tablet; Refill: 2  5. GERD without esophagitis Rx: - omeprazole (PRILOSEC) 20 MG capsule; Take 1 capsule (20 mg total) by mouth 2 (two) times daily before a meal.  Dispense: 60 capsule; Refill: 5   Preterm labor symptoms and general obstetric precautions including but not limited to vaginal bleeding, contractions, leaking of fluid and fetal movement were reviewed in detail with the patient. I discussed the assessment and treatment plan with the patient. The patient was provided an opportunity to ask questions and all were answered. The patient agreed with the plan and demonstrated an understanding of the instructions. The patient was advised to call back or seek an in-person office evaluation/go to [facility] at [facility] for any urgent or concerning symptoms. Please refer to After Visit Summary for other counseling recommendations.   I provided 10 minutes of face-to-face time during this encounter.  Return in about 4 weeks (around 07/22/2019) for ROB, 2 hour OGTT.  Future Appointments  Date Time Provider Department Center  07/13/2019  1:10 PM [facility] NURSE [facility]  07/13/2019  1:15 PM [facility] [place] 4 WH-[facility] M[facility]US    Coral Ceo, MD Center for [facility], [facility] 06/24/2019

## 2019-06-24 NOTE — Progress Notes (Signed)
Pt is on the phone preparing for virtual visit with provider. [redacted]w[redacted]d.  

## 2019-07-06 ENCOUNTER — Telehealth: Payer: Self-pay

## 2019-07-06 NOTE — Telephone Encounter (Signed)
Attempted to reach patient and phone rings busy. Kathrene Alu RN

## 2019-07-06 NOTE — Telephone Encounter (Signed)
-----   Message from Ugonna A Anyanwu, MD sent at 06/14/2019  1:09 PM EDT ----- Alpha thalassemia silent carrier noted on hemoglobin evaluation in [facility] test FOB testing and Genetic counseling recommended Please call to inform patient of results and recommendations.  

## 2019-07-06 NOTE — Telephone Encounter (Signed)
Patient made aware of alpha thalassemia silent carrier. Patient states at this time does not want referral for genetic counseling. Kathrene Alu RN

## 2019-07-13 ENCOUNTER — Ambulatory Visit (HOSPITAL_COMMUNITY): Payer: Medicaid Other | Attending: Maternal & Fetal Medicine

## 2019-07-13 ENCOUNTER — Encounter (HOSPITAL_COMMUNITY): Payer: Self-pay

## 2019-07-13 ENCOUNTER — Ambulatory Visit (HOSPITAL_COMMUNITY): Payer: Medicaid Other

## 2019-07-23 ENCOUNTER — Other Ambulatory Visit: Payer: Medicaid Other

## 2019-07-23 ENCOUNTER — Encounter: Payer: Medicaid Other | Admitting: Family Medicine

## 2019-08-04 ENCOUNTER — Telehealth: Payer: Self-pay | Admitting: Medical

## 2019-09-24 NOTE — L&D Delivery Note (Signed)
   Delivery Note Pt progressed to C/C/BBOW @ introitus w/minimal augmentation. Had SROM w/light mec.  After 1 push, at 9:06 PM a viable female was delivered via Vaginal, Spontaneous (Presentation: Left Occiput Anterior).  APGAR: 9, 9; weight pending. After 1 minute, the cord was clamped and cut. 40 units of pitocin diluted in 1000cc LR was infused rapidly IV.  The placenta separated spontaneously and delivered via CCT and maternal pushing effort.  It was inspected and appears to be intact with a 3 VC.    Anesthesia: Epidural Episiotomy: None Lacerations: None Suture Repair:  Est. Blood Loss (mL): 100  Mom to postpartum.  Baby to Couplet care / Skin to Skin.  Jacklyn Shell 09/21/2020, 9:27 PM

## 2019-10-19 ENCOUNTER — Telehealth: Payer: Self-pay

## 2019-10-19 NOTE — Telephone Encounter (Signed)
TC from pt requesting RTW note states she works from home.  Pt last seen for virtual ROB 06/24/19 Delivered 10/06/19 Vaginal delivery.  Pt states she delivered in [place]  Still in [place].  Has not scheduled a post partum pt states when she tried to make appt we had no ava appts Pt notes not having a method of transportation at this time.

## 2020-04-08 ENCOUNTER — Ambulatory Visit (HOSPITAL_COMMUNITY): Payer: Self-pay

## 2020-04-08 ENCOUNTER — Other Ambulatory Visit: Payer: Self-pay

## 2020-04-08 ENCOUNTER — Encounter: Payer: Self-pay | Admitting: Emergency Medicine

## 2020-04-08 ENCOUNTER — Ambulatory Visit
Admission: EM | Admit: 2020-04-08 | Discharge: 2020-04-08 | Disposition: A | Discharge status: Home / Self Care | Payer: Medicaid Other | Attending: Emergency Medicine | Admitting: Emergency Medicine

## 2020-04-08 DIAGNOSIS — N760 Acute vaginitis: Secondary | ICD-10-CM | POA: Diagnosis not present

## 2020-04-08 DIAGNOSIS — B9689 Other specified bacterial agents as the cause of diseases classified elsewhere: Secondary | ICD-10-CM | POA: Diagnosis not present

## 2020-04-08 MED ORDER — METRONIDAZOLE 500 MG PO TABS: 500.0000 mg | ORAL_TABLET | Freq: Two times a day (BID) | ORAL | 0 refills | Status: DC

## 2020-04-08 NOTE — Discharge Instructions (Signed)
Take antibiotic twice daily with food. Return for worsening discharge, abdominal or back pain, fever.

## 2020-04-08 NOTE — ED Provider Notes (Signed)
[facility]    CSN: [id number] Arrival date & time: 04/08/20  1127      History   Chief Complaint Chief Complaint  Patient presents with  . Vaginal Discharge    HPI Christina Willis is a 24 y.o. female [redacted] weeks gestation with history of asthma and thalassemia silent carrier status presenting for BV flare.  Endorsing vaginal discharge with irritation x4 days.  No urinary symptoms, abdominal pain, pelvic pain, back pain, fever.  States it feels similar to previous flares.  Tolerates Flagyl well.  Not currently breast-feeding.   Past Medical History:  Diagnosis Date  . Alpha thalassemia silent carrier 06/14/2019   Aa/a- [ ]  FOB testing and Genetic counseling recommended  . Asthma     Patient Active Problem List   Diagnosis Date Noted  . Alpha thalassemia silent carrier 06/14/2019  . Insufficient prenatal care 05/27/2019  . Maternal obesity syndrome, antepartum 05/27/2019  . Supervision of other normal pregnancy, antepartum 03/16/2019  . Rh negative, antepartum 04/04/2016    Past Surgical History:  Procedure Laterality Date  . NO PAST SURGERIES      OB History    Gravida  3   Para  1   Term  1   Preterm      AB      Living  1     SAB      TAB      Ectopic      Multiple      Live Births  1            Home Medications    Prior to Admission medications   Medication Sig Start Date End Date Taking? Authorizing Provider  acetaminophen (TYLENOL) 325 MG tablet Take 650 mg by mouth every 6 (six) hours as needed for mild pain. Reported on 04/04/2016    [provider]  acyclovir (ZOVIRAX) 400 MG tablet Take 400 mg by mouth See admin instructions. Reported on 04/04/2016 12/18/15   [provider]  albuterol (PROAIR HFA) 108 (90 Base) MCG/ACT inhaler Inhale 2 puffs into the lungs every 4 (four) hours as needed for wheezing or shortness of breath (asthma). Reported on 04/04/2016    [provider]  aspirin EC 81 MG  tablet Take 1 tablet (81 mg total) by mouth daily. Take after 12 weeks for prevention of preeclampsia later in pregnancy 06/24/19   08/24/19, MD  Blood Pressure Monitoring (BLOOD PRESSURE CUFF) MISC 1 Device by Does not apply route once a week. 06/24/19   08/24/19, MD  ciprofloxacin-hydrocortisone (CIPRO HC) OTIC suspension Place 3 drops into the right ear 2 (two) times daily. For seven to ten days. Patient not taking: Reported on 05/27/2019 06/29/18   08/29/18, MD  metroNIDAZOLE (FLAGYL) 500 MG tablet Take 1 tablet (500 mg total) by mouth 2 (two) times daily. 04/08/20   Hall-Potvin, 04/10/20, PA-C  Olopatadine HCl (PAZEO) 0.7 % SOLN Place 1 drop into both eyes 2 (two) times daily as needed (itching/ seasonal allergies). Reported on 04/04/2016    [provider]  omeprazole (PRILOSEC) 20 MG capsule Take 1 capsule (20 mg total) by mouth 2 (two) times daily before a meal. 06/24/19   08/24/19, MD  Prenat-Fe Poly-Methfol-FA-DHA (VITAFOL ULTRA) 29-0.6-0.4-200 MG CAPS Take 1 tablet by mouth daily. 06/24/19   08/24/19, MD    Family History Family History  Problem Relation Age of Onset  . Cancer Neg Hx   . Diabetes  Neg Hx   . Hypertension Neg Hx     Social History Social History   Tobacco Use  . Smoking status: Former Games developer  . Smokeless tobacco: Never Used  Vaping Use  . Vaping Use: Never used  Substance Use Topics  . Alcohol use: No  . Drug use: No     Allergies   Apple and Banana   Review of Systems As per HPI   Physical Exam Triage Vital Signs ED Triage Vitals  Enc Vitals Group     BP      Pulse      Resp      Temp      Temp src      SpO2      Weight      Height      Head Circumference      Peak Flow      Pain Score      Pain Loc      Pain Edu?      Excl. in GC?    No data found.  Updated Vital Signs BP 113/73 (BP Location: Left Arm)   Pulse (!) 103   Temp 97.9 F (36.6 C) (Oral)   Resp 18   LMP 01/05/2020    SpO2 98%   Visual Acuity Right Eye Distance:   Left Eye Distance:   Bilateral Distance:    Right Eye Near:   Left Eye Near:    Bilateral Near:     Physical Exam Constitutional:      General: She is not in acute distress. HENT:     Head: Normocephalic and atraumatic.  Eyes:     General: No scleral icterus.    Pupils: Pupils are equal, round, and reactive to light.  Cardiovascular:     Rate and Rhythm: Normal rate.  Pulmonary:     Effort: Pulmonary effort is normal.  Abdominal:     General: Bowel sounds are normal.     Palpations: Abdomen is soft.     Tenderness: There is no abdominal tenderness. There is no right CVA tenderness, left CVA tenderness or guarding.  Skin:    Coloration: Skin is not jaundiced or pale.  Neurological:     Mental Status: She is alert and oriented to person, place, and time.      UC Treatments / Results  Labs (all labs ordered are listed, but only abnormal results are displayed) Labs Reviewed - No data to display  EKG   Radiology No results found.  Procedures Procedures (including critical care time)  Medications Ordered in UC Medications - No data to display  Initial Impression / Assessment and Plan / UC Course  I have reviewed the triage vital signs and the nursing notes.  Pertinent labs & imaging results that were available during my care of the patient were reviewed by me and considered in my medical decision making (see chart for details).     H&P consistent with BV flare.  Patient not concerned for STIs at this time: Declined testing.  Flagyl sent.  Return precautions discussed, pt verbalized understanding and is agreeable to plan. Final Clinical Impressions(s) / UC Diagnoses   Final diagnoses:  BV (bacterial vaginosis)     Discharge Instructions     Take antibiotic twice daily with food. Return for worsening discharge, abdominal or back pain, fever.    ED Prescriptions    Medication Sig Dispense Auth. Provider     metroNIDAZOLE (FLAGYL) 500 MG tablet Take 1 tablet (  500 mg total) by mouth 2 (two) times daily. 14 tablet Hall-Potvin, [place], PA-C     PDMP not reviewed this encounter.   Hall-Potvin, [place], [place] 04/08/20 1158

## 2020-04-08 NOTE — ED Triage Notes (Signed)
Pt presents to [facility] for assessment of vaginal discharge x 4 days.  States hx of BV.

## 2020-04-27 ENCOUNTER — Ambulatory Visit
Admission: EM | Admit: 2020-04-27 | Discharge: 2020-04-27 | Disposition: A | Discharge status: Home / Self Care | Payer: Medicaid Other | Attending: Emergency Medicine | Admitting: Emergency Medicine

## 2020-04-27 ENCOUNTER — Encounter: Payer: Self-pay | Admitting: Emergency Medicine

## 2020-04-27 ENCOUNTER — Other Ambulatory Visit: Payer: Self-pay

## 2020-04-27 DIAGNOSIS — R8290 Unspecified abnormal findings in urine: Secondary | ICD-10-CM | POA: Insufficient documentation

## 2020-04-27 DIAGNOSIS — N760 Acute vaginitis: Secondary | ICD-10-CM

## 2020-04-27 DIAGNOSIS — B9689 Other specified bacterial agents as the cause of diseases classified elsewhere: Secondary | ICD-10-CM | POA: Diagnosis not present

## 2020-04-27 LAB — POCT URINALYSIS DIP (MANUAL ENTRY)
Bilirubin, UA: NEGATIVE
Blood, UA: NEGATIVE
Glucose, UA: NEGATIVE mg/dL
Ketones, POC UA: NEGATIVE mg/dL
Nitrite, UA: NEGATIVE
Protein Ur, POC: NEGATIVE mg/dL
Spec Grav, UA: >=1.03 — AB (ref 1.010–1.025)
Urobilinogen, UA: 0.2 E.U./dL
pH, UA: 6 (ref 5.0–8.0)

## 2020-04-27 MED ORDER — METRONIDAZOLE 500 MG PO TABS: 500.0000 mg | ORAL_TABLET | Freq: Two times a day (BID) | ORAL | 0 refills | Status: DC

## 2020-04-27 NOTE — ED Provider Notes (Signed)
[facility]    CSN: [id number] Arrival date & time: 04/27/20  1358      History   Chief Complaint Chief Complaint  Patient presents with  . Vaginal Discharge    HPI Christina Willis is a 24 y.o. female presenting for persistent BV, possible UTI.  Patient endorsing cloudy, malodorous urine for the last few days.  States she was treated on 7/17 for BV (was seen by me).  Missed multiple doses of Flagyl and is concerned that it not fully resolve.  No concern for STI.  Currently pregnant: No abdominal or pelvic pain, bleeding   Past Medical History:  Diagnosis Date  . Alpha thalassemia silent carrier 06/14/2019   Aa/a- [ ]  FOB testing and Genetic counseling recommended  . Asthma     Patient Active Problem List   Diagnosis Date Noted  . Alpha thalassemia silent carrier 06/14/2019  . Insufficient prenatal care 05/27/2019  . Maternal obesity syndrome, antepartum 05/27/2019  . Supervision of other normal pregnancy, antepartum 03/16/2019  . Rh negative, antepartum 04/04/2016    Past Surgical History:  Procedure Laterality Date  . NO PAST SURGERIES      OB History    Gravida  3   Para  1   Term  1   Preterm      AB      Living  1     SAB      TAB      Ectopic      Multiple      Live Births  1            Home Medications    Prior to Admission medications   Medication Sig Start Date End Date Taking? Authorizing Provider  acetaminophen (TYLENOL) 325 MG tablet Take 650 mg by mouth every 6 (six) hours as needed for mild pain. Reported on 04/04/2016    [provider]  acyclovir (ZOVIRAX) 400 MG tablet Take 400 mg by mouth See admin instructions. Reported on 04/04/2016 12/18/15   [provider]  albuterol (PROAIR HFA) 108 (90 Base) MCG/ACT inhaler Inhale 2 puffs into the lungs every 4 (four) hours as needed for wheezing or shortness of breath (asthma). Reported on 04/04/2016    [provider]  aspirin EC 81 MG tablet  Take 1 tablet (81 mg total) by mouth daily. Take after 12 weeks for prevention of preeclampsia later in pregnancy 06/24/19   08/24/19, MD  Blood Pressure Monitoring (BLOOD PRESSURE CUFF) MISC 1 Device by Does not apply route once a week. 06/24/19   08/24/19, MD  metroNIDAZOLE (FLAGYL) 500 MG tablet Take 1 tablet (500 mg total) by mouth 2 (two) times daily. 04/27/20   Hall-Potvin, 06/27/20, PA-C  Olopatadine HCl (PAZEO) 0.7 % SOLN Place 1 drop into both eyes 2 (two) times daily as needed (itching/ seasonal allergies). Reported on 04/04/2016    [provider]  omeprazole (PRILOSEC) 20 MG capsule Take 1 capsule (20 mg total) by mouth 2 (two) times daily before a meal. 06/24/19   08/24/19, MD  Prenat-Fe Poly-Methfol-FA-DHA (VITAFOL ULTRA) 29-0.6-0.4-200 MG CAPS Take 1 tablet by mouth daily. 06/24/19   08/24/19, MD    Family History Family History  Problem Relation Age of Onset  . Cancer Neg Hx   . Diabetes Neg Hx   . Hypertension Neg Hx     Social History Social History   Tobacco Use  . Smoking status: Former [employer]  .  Smokeless tobacco: Never Used  Vaping Use  . Vaping Use: Never used  Substance Use Topics  . Alcohol use: No  . Drug use: No     Allergies   Apple and Banana   Review of Systems As per HPI   Physical Exam Triage Vital Signs ED Triage Vitals  Enc Vitals Group     BP      Pulse      Resp      Temp      Temp src      SpO2      Weight      Height      Head Circumference      Peak Flow      Pain Score      Pain Loc      Pain Edu?      Excl. in GC?    No data found.  Updated Vital Signs BP 122/75 (BP Location: Left Arm)   Pulse 99   Temp 98.2 F (36.8 C) (Oral)   Resp 18   LMP 01/05/2020   SpO2 98%   Visual Acuity Right Eye Distance:   Left Eye Distance:   Bilateral Distance:    Right Eye Near:   Left Eye Near:    Bilateral Near:     Physical Exam Constitutional:      General: She is not in  acute distress. HENT:     Head: Normocephalic and atraumatic.  Eyes:     General: No scleral icterus.    Pupils: Pupils are equal, round, and reactive to light.  Cardiovascular:     Rate and Rhythm: Normal rate.  Pulmonary:     Effort: Pulmonary effort is normal.  Abdominal:     General: Bowel sounds are normal.     Palpations: Abdomen is soft.     Tenderness: There is no abdominal tenderness. There is no right CVA tenderness, left CVA tenderness or guarding.  Skin:    Coloration: Skin is not jaundiced or pale.  Neurological:     Mental Status: She is alert and oriented to person, place, and time.      UC Treatments / Results  Labs (all labs ordered are listed, but only abnormal results are displayed) Labs Reviewed  POCT URINALYSIS DIP (MANUAL ENTRY) - Abnormal; Notable for the following components:      Result Value   Spec Grav, UA >=1.030 (*)    Leukocytes, UA Small (1+) (*)    All other components within normal limits  URINE CULTURE    EKG   Radiology No results found.  Procedures Procedures (including critical care time)  Medications Ordered in UC Medications - No data to display  Initial Impression / Assessment and Plan / UC Course  I have reviewed the triage vital signs and the nursing notes.  Pertinent labs & imaging results that were available during my care of the patient were reviewed by me and considered in my medical decision making (see chart for details).     Patient currently pregnant.  Urine dipstick done in office, reviewed by me: Positive for elevated specific gravity, leukocytes.  Culture pending.  Will await culture prior to treating for possible UTI.  Sent Flagyl and stressed importance of antibiotic compliance.  Will follow up with OB for further refills if needed.  Return precautions discussed, pt verbalized understanding and is agreeable to plan. Final Clinical Impressions(s) / UC Diagnoses   Final diagnoses:  BV (bacterial vaginosis)  Malodorous urine     Discharge Instructions     Very important to take antibiotics as directed: Set timers on your phone to help remind you to take it at breakfast and dinner. Complete course in its entirety and follow-up with OB for further evaluation/management.    ED Prescriptions    Medication Sig Dispense Auth. Provider   metroNIDAZOLE (FLAGYL) 500 MG tablet Take 1 tablet (500 mg total) by mouth 2 (two) times daily. 14 tablet Hall-Potvin, [place], PA-C     PDMP not reviewed this encounter.   Hall-Potvin, [place], [place] 04/27/20 1426

## 2020-04-27 NOTE — Discharge Instructions (Signed)
Very important to take antibiotics as directed: Set timers on your phone to help remind you to take it at breakfast and dinner. Complete course in its entirety and follow-up with OB for further evaluation/management.

## 2020-04-27 NOTE — ED Triage Notes (Signed)
Pt presents to [facility] for assessment after being diagnosed with BV on 7/17.  Patient states she skipped multiple doses and does not think it cleared up.  States discharge went away and came back, but vaginal discomfort has been constant

## 2020-04-30 LAB — URINE CULTURE: Culture: >=100000 — AB

## 2020-05-02 ENCOUNTER — Telehealth: Payer: Self-pay

## 2020-05-02 DIAGNOSIS — N390 Urinary tract infection, site not specified: Secondary | ICD-10-CM

## 2020-05-02 NOTE — Telephone Encounter (Signed)
Urine culture positive.  Keflex 500mg  QID x 5 days sent to pharmacy of record.  Pt made aware of positive culture and to complete full course of abx.  Pt denies any questions.

## 2020-05-12 ENCOUNTER — Ambulatory Visit: Payer: Medicaid Other

## 2020-05-12 DIAGNOSIS — Z3492 Encounter for supervision of normal pregnancy, unspecified, second trimester: Secondary | ICD-10-CM | POA: Insufficient documentation

## 2020-05-12 NOTE — Progress Notes (Signed)
Virtual Visit via Telephone Note  I connected with .......at .... AM EDT by telephone and verified that I am speaking with the correct person using two identifiers.  I discussed the limitations, risks, security and privacy concerns of performing an evaluation and management service by telephone and the availability of in person appointments. I also discussed with the patient that there may be a patient responsible charge related to this service. The patient expressed understanding and agreed to proceed.  PRENATAL INTAKE SUMMARY  Ms. Tolliver presents today New OB Nurse Interview.  OB History     Gravida  3   Para  2   Term  2   Preterm  0   AB     Living  3      SAB     TAB     Ectopic  0   Multiple  0   Live Births  2           I have reviewed the patient's medical, obstetrical, social, and family histories, medications, and available lab results.  SUBJECTIVE Patient has no complaints today.  OBJECTIVE Initial nurse interview for history (New OB)  GENERAL APPEARANCE Oriented to person, place and time, non-face to face interview  EDD: 09/23/2020 GA: [redacted]w[redacted]d [id number] FHT: non-face to face  ASSESSMENT/PLAN Normal pregnancy PNC at [facility] Prenatal labs and PE at next visit with provider Advised to enroll in Babys[employer]s   The patient was advised to call back or seek an in-person evaluation if the symptoms worsen or if the condition fails to improve as anticipated.  I provided 15 minutes of non-face-to-face time during this encounter.   Angelica Pou, RN

## 2020-05-19 ENCOUNTER — Encounter: Payer: Medicaid Other | Admitting: Obstetrics and Gynecology

## 2020-06-10 ENCOUNTER — Other Ambulatory Visit: Payer: Self-pay

## 2020-06-10 ENCOUNTER — Emergency Department: Admit: 2020-06-10 | Discharge status: Absent without leave | Payer: Self-pay

## 2020-06-10 ENCOUNTER — Emergency Department (INDEPENDENT_AMBULATORY_CARE_PROVIDER_SITE_OTHER)
Admission: EM | Admit: 2020-06-10 | Discharge: 2020-06-10 | Disposition: A | Discharge status: Home / Self Care | Payer: Medicaid Other | Source: Home / Self Care

## 2020-06-10 DIAGNOSIS — B354 Tinea corporis: Secondary | ICD-10-CM | POA: Diagnosis not present

## 2020-06-10 DIAGNOSIS — N760 Acute vaginitis: Secondary | ICD-10-CM | POA: Diagnosis not present

## 2020-06-10 DIAGNOSIS — B9689 Other specified bacterial agents as the cause of diseases classified elsewhere: Secondary | ICD-10-CM

## 2020-06-10 MED ORDER — METRONIDAZOLE 500 MG PO TABS: 500.0000 mg | ORAL_TABLET | Freq: Two times a day (BID) | ORAL | 0 refills | Status: DC

## 2020-06-10 MED ORDER — KETOCONAZOLE 2 % EX CREA: 1.0000 "application " | TOPICAL_CREAM | Freq: Two times a day (BID) | CUTANEOUS | 0 refills | Status: DC

## 2020-06-10 NOTE — ED Triage Notes (Signed)
Pt states that she has a rash on her inner thighs. x4 days. Pt states that she is not vaccinated.

## 2020-06-10 NOTE — ED Provider Notes (Signed)
[facility]    CSN: [id number] Arrival date & time: 06/10/20  7741      History   Chief Complaint Chief Complaint  Patient presents with  . Rash    HPI Christina Willis is a 24 y.o. female.   Is the initial visit for this 24 year old woman at [facility] urgent care.  She presents with a rash on her inner thighs.  The rash is burning in quality and is characterized by chafing.  The skin is rough.  The rash began several days ago.  Patient also has bacterial vaginitis and would like treatment for that.     Past Medical History:  Diagnosis Date  . Alpha thalassemia silent carrier 06/14/2019   Aa/a- [ ]  FOB testing and Genetic counseling recommended  . Asthma     Patient Active Problem List   Diagnosis Date Noted  . Encounter for supervision of normal pregnancy, unspecified, second trimester 05/12/2020  . Alpha thalassemia silent carrier 06/14/2019  . Insufficient prenatal care 05/27/2019  . Maternal obesity syndrome, antepartum 05/27/2019  . Supervision of other normal pregnancy, antepartum 03/16/2019  . Rh negative, antepartum 04/04/2016    Past Surgical History:  Procedure Laterality Date  . NO PAST SURGERIES      OB History    Gravida  3   Para  2   Term  2   Preterm      AB      Living  2     SAB      TAB      Ectopic      Multiple      Live Births  2            Home Medications    Prior to Admission medications   Medication Sig Start Date End Date Taking? Authorizing Provider  albuterol (PROAIR HFA) 108 (90 Base) MCG/ACT inhaler Inhale 2 puffs into the lungs every 4 (four) hours as needed for wheezing or shortness of breath (asthma). Reported on 04/04/2016    [provider]  ketoconazole (NIZORAL) 2 % cream Apply 1 application topically 2 (two) times daily. 06/10/20   06/12/20, MD  metroNIDAZOLE (FLAGYL) 500 MG tablet Take 1 tablet (500 mg total) by mouth 2 (two) times daily with a meal. DO NOT CONSUME  ALCOHOL WHILE TAKING THIS MEDICATION. 06/10/20   06/12/20, MD    Family History Family History  Problem Relation Age of Onset  . Cancer Neg Hx   . Diabetes Neg Hx   . Hypertension Neg Hx     Social History Social History   Tobacco Use  . Smoking status: Former [employer]vina [employer]le  . Smokeless tobacco: Never Used  Vaping Use  . Vaping Use: Never used  Substance Use Topics  . Alcohol use: No  . Drug use: No     Allergies   Apple and Banana   Review of Systems Review of Systems  Genitourinary: Positive for vaginal discharge.  Skin: Positive for rash.  All other systems reviewed and are negative.    Physical Exam Triage Vital Signs ED Triage Vitals  Enc Vitals Group     BP 06/10/20 1009 119/72     Pulse Rate 06/10/20 1009 89     Resp --      Temp 06/10/20 1009 (!) 97.4 F (36.3 C)     Temp Source 06/10/20 1009 Oral     SpO2 06/10/20 1009 96 %     Weight 06/10/20 1007 210 lb (95.3  kg)     Height 06/10/20 1007 5\' 5"  (1.651 m)     Head Circumference --      Peak Flow --      Pain Score 06/10/20 1006 6     Pain Loc --      Pain Edu? --      Excl. in GC? --    No data found.  Updated Vital Signs BP 119/72 (BP Location: Left Arm)   Pulse 89   Temp (!) 97.4 F (36.3 C) (Oral)   Ht 5\' 5"  (1.651 m)   Wt 95.3 kg   LMP 12/18/2019   SpO2 96%   Breastfeeding Unknown   BMI 34.95 kg/m    Physical Exam Vitals and nursing note reviewed.  Constitutional:      General: She is not in acute distress.    Appearance: Normal appearance. She is obese.  HENT:     Head: Normocephalic.  Eyes:     Conjunctiva/sclera: Conjunctivae normal.  Pulmonary:     Effort: Pulmonary effort is normal.  Musculoskeletal:        General: Normal range of motion.     Cervical back: Normal range of motion and neck supple.  Skin:    General: Skin is warm and dry.     Comments: Rough sandpaper type skin on medial aspect of proximal thighs bilaterally  Neurological:     General: No  focal deficit present.     Mental Status: She is alert.  Psychiatric:        Mood and Affect: Mood normal.      UC Treatments / Results  Labs (all labs ordered are listed, but only abnormal results are displayed) Labs Reviewed - No data to display  EKG   Radiology No results found.  Procedures Procedures (including critical care time)  Medications Ordered in UC Medications - No data to display  Initial Impression / Assessment and Plan / UC Course  I have reviewed the triage vital signs and the nursing notes.  Pertinent labs & imaging results that were available during my care of the patient were reviewed by me and considered in my medical decision making (see chart for details).    Final Clinical Impressions(s) / UC Diagnoses   Final diagnoses:  Tinea corporis  Bacterial vaginitis     Discharge Instructions     Hydrocortisone cream will also help relieve some of the burning with a rash.    ED Prescriptions    Medication Sig Dispense Auth. Provider   metroNIDAZOLE (FLAGYL) 500 MG tablet Take 1 tablet (500 mg total) by mouth 2 (two) times daily with a meal. DO NOT CONSUME ALCOHOL WHILE TAKING THIS MEDICATION. 14 tablet , MD   ketoconazole (NIZORAL) 2 % cream Apply 1 application topically 2 (two) times daily. 30 g 12/20/2019, MD     I have reviewed the PDMP during this encounter.   Elvina Sidle, MD 06/10/20 1024

## 2020-06-10 NOTE — Discharge Instructions (Addendum)
Hydrocortisone cream will also help relieve some of the burning with a rash.

## 2020-08-09 ENCOUNTER — Ambulatory Visit: Payer: Self-pay

## 2020-09-04 LAB — OB RESULTS CONSOLE GBS: GBS: POSITIVE

## 2020-09-06 ENCOUNTER — Ambulatory Visit: Payer: Medicaid Other | Attending: Family Medicine

## 2020-09-12 ENCOUNTER — Inpatient Hospital Stay (HOSPITAL_COMMUNITY)
Admission: AD | Admit: 2020-09-12 | Discharge: 2020-09-15 | DRG: 833 | Disposition: A | Discharge status: Home / Self Care | Payer: Medicaid Other | Attending: Obstetrics and Gynecology | Admitting: Obstetrics and Gynecology

## 2020-09-12 ENCOUNTER — Other Ambulatory Visit: Payer: Self-pay

## 2020-09-12 ENCOUNTER — Encounter (HOSPITAL_COMMUNITY): Payer: Self-pay | Admitting: Obstetrics and Gynecology

## 2020-09-12 DIAGNOSIS — Z3A38 38 weeks gestation of pregnancy: Secondary | ICD-10-CM

## 2020-09-12 DIAGNOSIS — O99820 Streptococcus B carrier state complicating pregnancy: Secondary | ICD-10-CM | POA: Diagnosis present

## 2020-09-12 DIAGNOSIS — O26893 Other specified pregnancy related conditions, third trimester: Secondary | ICD-10-CM | POA: Diagnosis present

## 2020-09-12 DIAGNOSIS — Z20822 Contact with and (suspected) exposure to covid-19: Secondary | ICD-10-CM | POA: Diagnosis present

## 2020-09-12 DIAGNOSIS — O2303 Infections of kidney in pregnancy, third trimester: Secondary | ICD-10-CM | POA: Diagnosis present

## 2020-09-12 DIAGNOSIS — E669 Obesity, unspecified: Secondary | ICD-10-CM | POA: Diagnosis present

## 2020-09-12 DIAGNOSIS — O99213 Obesity complicating pregnancy, third trimester: Secondary | ICD-10-CM | POA: Diagnosis present

## 2020-09-12 DIAGNOSIS — Z3403 Encounter for supervision of normal first pregnancy, third trimester: Secondary | ICD-10-CM

## 2020-09-12 DIAGNOSIS — B9620 Unspecified Escherichia coli [E. coli] as the cause of diseases classified elsewhere: Secondary | ICD-10-CM | POA: Diagnosis present

## 2020-09-12 DIAGNOSIS — Z6791 Unspecified blood type, Rh negative: Secondary | ICD-10-CM

## 2020-09-12 DIAGNOSIS — O368330 Maternal care for abnormalities of the fetal heart rate or rhythm, third trimester, not applicable or unspecified: Secondary | ICD-10-CM | POA: Diagnosis present

## 2020-09-12 DIAGNOSIS — D563 Thalassemia minor: Secondary | ICD-10-CM | POA: Diagnosis present

## 2020-09-12 DIAGNOSIS — N390 Urinary tract infection, site not specified: Secondary | ICD-10-CM

## 2020-09-12 DIAGNOSIS — Z87891 Personal history of nicotine dependence: Secondary | ICD-10-CM | POA: Diagnosis not present

## 2020-09-12 DIAGNOSIS — O360130 Maternal care for anti-D [Rh] antibodies, third trimester, not applicable or unspecified: Secondary | ICD-10-CM

## 2020-09-12 DIAGNOSIS — B9629 Other Escherichia coli [E. coli] as the cause of diseases classified elsewhere: Secondary | ICD-10-CM

## 2020-09-12 DIAGNOSIS — M545 Low back pain, unspecified: Secondary | ICD-10-CM | POA: Diagnosis present

## 2020-09-12 DIAGNOSIS — N12 Tubulo-interstitial nephritis, not specified as acute or chronic: Secondary | ICD-10-CM

## 2020-09-12 LAB — URINALYSIS, ROUTINE W REFLEX MICROSCOPIC
Bilirubin Urine: NEGATIVE
Glucose, UA: NEGATIVE mg/dL
Hgb urine dipstick: NEGATIVE
Ketones, ur: 5 mg/dL — AB
Nitrite: POSITIVE — AB
Protein, ur: 30 mg/dL — AB
Specific Gravity, Urine: 1.014 (ref 1.005–1.030)
WBC, UA: >50 WBC/hpf — ABNORMAL HIGH (ref 0–5)
pH: 6 (ref 5.0–8.0)

## 2020-09-12 LAB — RESP PANEL BY RT-PCR (FLU A&B, COVID) ARPGX2
Influenza A by PCR: NEGATIVE
Influenza B by PCR: NEGATIVE
SARS Coronavirus 2 by RT PCR: NEGATIVE

## 2020-09-12 LAB — CBC
HCT: 36.3 % (ref 36.0–46.0)
Hemoglobin: 11.3 g/dL — ABNORMAL LOW (ref 12.0–15.0)
MCH: 27.2 pg (ref 26.0–34.0)
MCHC: 31.1 g/dL (ref 30.0–36.0)
MCV: 87.5 fL (ref 80.0–100.0)
Platelets: 195 10*3/uL (ref 150–400)
RBC: 4.15 MIL/uL (ref 3.87–5.11)
RDW: 15.4 % (ref 11.5–15.5)
WBC: 14 10*3/uL — ABNORMAL HIGH (ref 4.0–10.5)
nRBC: 0 % (ref 0.0–0.2)

## 2020-09-12 LAB — COMPREHENSIVE METABOLIC PANEL
ALT: 19 U/L (ref 0–44)
AST: 19 U/L (ref 15–41)
Albumin: 3 g/dL — ABNORMAL LOW (ref 3.5–5.0)
Alkaline Phosphatase: 115 U/L (ref 38–126)
Anion gap: 12 (ref 5–15)
BUN: 7 mg/dL (ref 6–20)
CO2: 22 mmol/L (ref 22–32)
Calcium: 9.1 mg/dL (ref 8.9–10.3)
Chloride: 102 mmol/L (ref 98–111)
Creatinine, Ser: 0.65 mg/dL (ref 0.44–1.00)
GFR, Estimated: >60 mL/min (ref >60)
Glucose, Bld: 88 mg/dL (ref 70–99)
Potassium: 4.4 mmol/L (ref 3.5–5.1)
Sodium: 136 mmol/L (ref 135–145)
Total Bilirubin: 0.6 mg/dL (ref 0.3–1.2)
Total Protein: 6.3 g/dL — ABNORMAL LOW (ref 6.5–8.1)

## 2020-09-12 LAB — TYPE AND SCREEN
ABO/RH(D): O NEG
Antibody Screen: POSITIVE

## 2020-09-12 LAB — LACTIC ACID, PLASMA: Lactic Acid, Venous: 1.8 mmol/L (ref 0.5–1.9)

## 2020-09-12 MED ORDER — LACTATED RINGERS IV SOLN: INTRAVENOUS | Status: AC

## 2020-09-12 MED ORDER — OXYTOCIN-SODIUM CHLORIDE 30-0.9 UT/500ML-% IV SOLN: 2.5000 [IU]/h | INTRAVENOUS | Status: DC

## 2020-09-12 MED ORDER — SODIUM CHLORIDE 0.9 % IV SOLN
2.0000 g | Freq: Three times a day (TID) | INTRAVENOUS | Status: DC
  Administered 2020-09-12 – 2020-09-14 (×6): 2 g via INTRAVENOUS
  Filled 2020-09-12 (×11): qty 2

## 2020-09-12 MED ORDER — SODIUM CHLORIDE 0.9 % IV SOLN: 5.0000 10*6.[IU] | Freq: Once | INTRAVENOUS | Status: DC

## 2020-09-12 MED ORDER — LACTATED RINGERS IV SOLN: INTRAVENOUS | Status: DC

## 2020-09-12 MED ORDER — ACETAMINOPHEN 325 MG PO TABS: 650.0000 mg | ORAL_TABLET | ORAL | Status: DC | PRN

## 2020-09-12 MED ORDER — LACTATED RINGERS IV BOLUS: 1000.0000 mL | Freq: Once | INTRAVENOUS | Status: DC

## 2020-09-12 MED ORDER — OXYCODONE HCL 5 MG PO TABS
5.0000 mg | ORAL_TABLET | ORAL | Status: DC | PRN
Start: 2020-09-12 — End: 2020-09-15
  Administered 2020-09-12 – 2020-09-14 (×6): 5 mg via ORAL
  Filled 2020-09-12 (×6): qty 1

## 2020-09-12 MED ORDER — ACETAMINOPHEN 500 MG PO TABS
1000.0000 mg | ORAL_TABLET | Freq: Four times a day (QID) | ORAL | Status: DC | PRN
  Administered 2020-09-12: 1000 mg via ORAL
  Filled 2020-09-12: qty 2

## 2020-09-12 MED ORDER — LACTATED RINGERS IV SOLN: 500.0000 mL | INTRAVENOUS | Status: DC | PRN

## 2020-09-12 MED ORDER — SOD CITRATE-CITRIC ACID 500-334 MG/5ML PO SOLN: 30.0000 mL | ORAL | Status: DC | PRN

## 2020-09-12 MED ORDER — LIDOCAINE HCL (PF) 1 % IJ SOLN: 30.0000 mL | INTRAMUSCULAR | Status: DC | PRN

## 2020-09-12 MED ORDER — ONDANSETRON HCL 4 MG/2ML IJ SOLN: 4.0000 mg | Freq: Four times a day (QID) | INTRAMUSCULAR | Status: DC | PRN

## 2020-09-12 MED ORDER — OXYCODONE-ACETAMINOPHEN 5-325 MG PO TABS: 2.0000 | ORAL_TABLET | ORAL | Status: DC | PRN

## 2020-09-12 MED ORDER — IBUPROFEN 600 MG PO TABS: 600.0000 mg | ORAL_TABLET | Freq: Three times a day (TID) | ORAL | Status: DC | PRN

## 2020-09-12 MED ORDER — PENICILLIN G POT IN DEXTROSE 60000 UNIT/ML IV SOLN: 3.0000 10*6.[IU] | INTRAVENOUS | Status: DC

## 2020-09-12 MED ORDER — OXYTOCIN BOLUS FROM INFUSION: 333.0000 mL | Freq: Once | INTRAVENOUS | Status: DC

## 2020-09-12 MED ORDER — OXYCODONE-ACETAMINOPHEN 5-325 MG PO TABS: 1.0000 | ORAL_TABLET | ORAL | Status: DC | PRN

## 2020-09-12 NOTE — Progress Notes (Addendum)
Patient arrived to the unit on a stretcher and had the following VS Temp 98.1 Pulse 139 RR 26 BP 112/36 O2SAT 98% Rm Air  Patient with normal mentation and says that she voided twice in MAU with what felt to her like normal quantities.  Patient with noticeable shivering.  Phoned Council Mechanic, [place] RN for [facility] and asked her to review case with me.  She thought asking MD for a lactic acid order was appropriate given that patient has a known infection and her VS are consistent with possible sepsis.  Dr. Jolayne Panther on the floor a few minutes later and verbal order received to increase LR to 100 ml/hr and to obtain a STAT lactic acid.  Follow up VS at 18:02  Temp 99.2 Pulse 125 RR 26 BP 89/56 O2SAT 100% Rm Air.

## 2020-09-12 NOTE — MAU Note (Signed)
Pt presents to MAU for intermittent mild ctx and back pain rated 8/10.  Endorses +FM, no vaginal bleeding or discharge.

## 2020-09-12 NOTE — H&P (Signed)
ANTEPARTUM ADMISSION HISTORY AND PHYSICAL  Christina Willis is a 24 y.o. female G3P2002 with IUP at [redacted]w[redacted]d by LMP presenting for unilateral low back pain in the setting of recently diagnosed UTI at [facility], found to be in early labor. Of note, pt was prescribed macrobid last week given E. Coli UTI diagnosed at [facility], but pt states that she has been unable to pick up her prescription for antibiotics. No reported fever or dysuria but endorsing chills, fatigue, urinary frequency and right low back pain as noted above. She also reports cloudy and foul-smelling urine.  She reports +FMs, No LOF, no VB, no blurry vision, headaches or peripheral edema, and RUQ pain.  She plans on breast and bottle feeding. She is undecided for birth control.  She received her prenatal care at [facility]   Dating: By  --->  Estimated Date of Delivery: 09/23/20  Sono: @[redacted]w[redacted]d , CWD, normal anatomy, variable presentation, 593g, 41% EFW  Prenatal History/Complications:  -Rh negative s/p rhogam in pregnancy -alpha thal carrier -limited prenatal care -E. Coli UTI (diagnosed at [facility] in early Dec w/o treatment)  Past Medical History: Past Medical History:  Diagnosis Date   Alpha thalassemia silent carrier 06/14/2019   Aa/a- [ ]  FOB testing and Genetic counseling recommended   Asthma     Past Surgical History: Past Surgical History:  Procedure Laterality Date   NO PAST SURGERIES      Obstetrical History: OB History    Gravida  3   Para  2   Term  2   Preterm      AB      Living  2     SAB      IAB      Ectopic      Multiple      Live Births  2           Social History Social History   Socioeconomic History   Marital status: Single    Spouse name: Not on file   Number of children: Not on file   Years of education: Not on file   Highest education level: Not on file  Occupational History   Not on file  Tobacco Use   Smoking status: Former Smoker   Smokeless tobacco: Never Used   06/16/2019 Use: Never used  Substance and Sexual Activity   Alcohol use: No   Drug use: No   Sexual activity: Yes    Partners: Male    Birth control/protection: None  Other Topics Concern   Not on file  Social History Narrative   Not on file   Social Determinants of Health   Financial Resource Strain: Not on file  Food Insecurity: Not on file  Transportation Needs: Not on file  Physical Activity: Not on file  Stress: Not on file  Social Connections: Not on file    Family History: Family History  Problem Relation Age of Onset   Cancer Neg Hx    Diabetes Neg Hx    Hypertension Neg Hx     Allergies: Allergies  Allergen Reactions   Apple Itching   Banana Itching   Peanut-Containing Drug Products Swelling    Raw peanuts    Medications Prior to Admission  Medication Sig Dispense Refill Last Dose   albuterol (VENTOLIN HFA) 108 (90 Base) MCG/ACT inhaler Inhale 2 puffs into the lungs every 4 (four) hours as needed for wheezing or shortness of breath (asthma). Reported on 04/04/2016   Past Week  at Unknown time   ketoconazole (NIZORAL) 2 % cream Apply 1 application topically 2 (two) times daily. 30 g 0 09/11/2020 at Unknown time   metroNIDAZOLE (FLAGYL) 500 MG tablet Take 1 tablet (500 mg total) by mouth 2 (two) times daily with a meal. DO NOT CONSUME ALCOHOL WHILE TAKING THIS MEDICATION. 14 tablet 0    nitrofurantoin, macrocrystal-monohydrate, (MACROBID) 100 MG capsule Take 100 mg by mouth 2 (two) times daily.        Review of Systems  All systems reviewed and negative except as stated in HPI  Blood pressure (!) 118/56, pulse (!) 126, temperature 98.5 F (36.9 C), temperature source Oral, resp. rate 16, height 5\' 5"  (1.651 m), weight 101.7 kg, last menstrual period 12/18/2019, SpO2 100 %, unknown if currently breastfeeding. General appearance: alert, cooperative and appears stated age Lungs: normal WOB Heart: tachycardic on admission, normalized  s/p IVF bolus Abdomen: soft, non-tender Back: exquisite right-sided CVA tenderness Extremities:  no sign of DVT Presentation: cephalic on ultrasound Fetal monitoringBaseline: 135 bpm, Variability: minimal >moderate, Accelerations: Reactive and Decelerations: several prolonged variable decels on arrival, now improved s/p materal repositioning but still extended periods of minimal variability Uterine activity: irregular Dilation: 2 Effacement (%): 50 Station: 0 Exam by:: Dr. [email or web address]  Prenatal labs: ABO, Rh: --/--/PENDING (12/21 1437)negative Antibody: PENDING (12/21 1437)positive (anti-lewis antibodies) Rubella:  immune RPR:  non-reactive HBsAg:   negative HIV:   non-reactive GBS:   positive ([facility] records) 1 hr Glucola: 139, 3hr gtt wnl Genetic screening wnl except alpha thal carrier Anatomy 01-19[birth year]1998 wnl @[redacted]w[redacted]d  but limited (no f/u ultrasounds)  Prenatal Transfer Tool  Maternal Diabetes: No Genetic Screening: Normal except for alpha thal carrier Maternal Ultrasounds/Referrals: Normal Fetal Ultrasounds or other Referrals:  None Maternal Substance Abuse:  No Significant Maternal Medications:  None Significant Maternal Lab Results: Group B Strep positive  Results for orders placed or performed during the hospital encounter of 09/12/20 (from the past 24 hour(s))  Urinalysis, Routine w reflex microscopic   Collection Time: 09/12/20  1:59 PM  Result Value Ref Range   Color, Urine YELLOW YELLOW   APPearance CLOUDY (A) CLEAR   Specific Gravity, Urine 1.014 1.005 - 1.030   pH 6.0 5.0 - 8.0   Glucose, UA NEGATIVE NEGATIVE mg/dL   Hgb urine dipstick NEGATIVE NEGATIVE   Bilirubin Urine NEGATIVE NEGATIVE   Ketones, ur 5 (A) NEGATIVE mg/dL   Protein, ur 30 (A) NEGATIVE mg/dL   Nitrite POSITIVE (A) NEGATIVE   Leukocytes,Ua LARGE (A) NEGATIVE   RBC / HPF 0-5 0 - 5 RBC/hpf   WBC, UA >50 (H) 0 - 5 WBC/hpf   Bacteria, UA MANY (A) NONE SEEN   Squamous Epithelial / LPF 0-5 0 - 5   WBC Clumps  PRESENT    Mucus PRESENT   Resp Panel by RT-PCR (Flu A&B, Covid) Vaginal/Rectal   Collection Time: 09/12/20  2:10 PM   Specimen: Vaginal/Rectal; Nasopharyngeal(NP) swabs in vial transport medium  Result Value Ref Range   SARS Coronavirus 2 by RT PCR NEGATIVE NEGATIVE   Influenza A by PCR NEGATIVE NEGATIVE   Influenza B by PCR NEGATIVE NEGATIVE  CBC   Collection Time: 09/12/20  2:37 PM  Result Value Ref Range   WBC 14.0 (H) 4.0 - 10.5 K/uL   RBC 4.15 3.87 - 5.11 MIL/uL   Hemoglobin 11.3 (L) 12.0 - 15.0 g/dL   HCT 09/14/20 09/14/20 - 81.4 %   MCV 87.5 80.0 - 100.0 fL   MCH 27.2 26.0 -  34.0 pg   MCHC 31.1 30.0 - 36.0 g/dL   RDW 78.5 88.5 - 02.7 %   Platelets 195 150 - 400 K/uL   nRBC 0.0 0.0 - 0.2 %  Comprehensive metabolic panel   Collection Time: 09/12/20  2:37 PM  Result Value Ref Range   Sodium 136 135 - 145 mmol/L   Potassium 4.4 3.5 - 5.1 mmol/L   Chloride 102 98 - 111 mmol/L   CO2 22 22 - 32 mmol/L   Glucose, Bld 88 70 - 99 mg/dL   BUN 7 6 - 20 mg/dL   Creatinine, Ser 7.41 0.44 - 1.00 mg/dL   Calcium 9.1 8.9 - 28.7 mg/dL   Total Protein 6.3 (L) 6.5 - 8.1 g/dL   Albumin 3.0 (L) 3.5 - 5.0 g/dL   AST 19 15 - 41 U/L   ALT 19 0 - 44 U/L   Alkaline Phosphatase 115 38 - 126 U/L   Total Bilirubin 0.6 0.3 - 1.2 mg/dL   GFR, Estimated >86 >76 mL/min   Anion gap 12 5 - 15  Type and screen [facility]   Collection Time: 09/12/20  2:37 PM  Result Value Ref Range   ABO/RH(D) PENDING    Antibody Screen PENDING    Sample Expiration      09/15/2020,2359 Performed at [facility], [address], [place], [place] [address]     Patient Active Problem List   Diagnosis Date Noted   Pyelonephritis affecting pregnancy in third trimester 09/12/2020   Encounter for supervision of normal pregnancy, unspecified, second trimester 05/12/2020   Alpha thalassemia silent carrier 06/14/2019   Insufficient prenatal care 05/27/2019   Maternal obesity syndrome,  antepartum 05/27/2019   Supervision of other normal pregnancy, antepartum 03/16/2019   Rh negative, antepartum 04/04/2016    Assessment/Plan:  Christina Willis is a 24 y.o. G3P2002 at [redacted]w[redacted]d here for latent labor with Category 2 strip on arrival to MAU now improved, in the setting of untreated UTI now consistent with pyelonephritis. Pt warrants admission to antepartum given need for IV antibiotics. Will likely transfer to L&D in the near future given already in early labor.  #Pyelonephritis   Untreated UTI: Pt with UCx showing >100,000 E. Coli at [facility] (records faxed). In conversation with pharmacy plan for IV cefepime 2g q8hr to cover pyelonephritis. S/p IVF bolus in MAU. Pt currently tolerating po intake well but consider re-bolusing if recurrent tachycardia. F/u Blood and Urine cultures--collected in MAU.  #Pain: prn tylenol & ibuprofen #FWB: Category 2 strip on arrival given minimal variability and intermittent variable decels > improved to Category 1 strip with maternal repositioning but continues to have periods of minimal variability at times. #ID: GBS positive >IV cefepime on admission for pyelo as noted above. Per pharmacy, cefepime will also cover GBS ppx. #MOF: breast and bottle #MOC: undecided #Circ: desired #Rh negative status: s/p rhogam in pregnancy. Plan for rhogam eval in postpartum period. 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 T&S pending on admission. Plan to notify blood bank if need for blood products. #Limited prenatal care: SW consult in postpartum period   Margaux Engen, Skipper Cliche, MD OB Fellow, Faculty Practice 09/12/2020 3:44 PM

## 2020-09-13 DIAGNOSIS — O2303 Infections of kidney in pregnancy, third trimester: Principal | ICD-10-CM

## 2020-09-13 DIAGNOSIS — Z3A38 38 weeks gestation of pregnancy: Secondary | ICD-10-CM

## 2020-09-13 LAB — CBC
HCT: 33.4 % — ABNORMAL LOW (ref 36.0–46.0)
Hemoglobin: 10.3 g/dL — ABNORMAL LOW (ref 12.0–15.0)
MCH: 27.2 pg (ref 26.0–34.0)
MCHC: 30.8 g/dL (ref 30.0–36.0)
MCV: 88.1 fL (ref 80.0–100.0)
Platelets: 194 10*3/uL (ref 150–400)
RBC: 3.79 MIL/uL — ABNORMAL LOW (ref 3.87–5.11)
RDW: 15.7 % — ABNORMAL HIGH (ref 11.5–15.5)
WBC: 12.6 10*3/uL — ABNORMAL HIGH (ref 4.0–10.5)
nRBC: 0 % (ref 0.0–0.2)

## 2020-09-13 LAB — BLOOD CULTURE ID PANEL (REFLEXED) - BCID2

## 2020-09-13 LAB — BASIC METABOLIC PANEL
Anion gap: 8 (ref 5–15)
BUN: 9 mg/dL (ref 6–20)
CO2: 22 mmol/L (ref 22–32)
Calcium: 8.6 mg/dL — ABNORMAL LOW (ref 8.9–10.3)
Chloride: 105 mmol/L (ref 98–111)
Creatinine, Ser: 0.66 mg/dL (ref 0.44–1.00)
GFR, Estimated: >60 mL/min (ref >60)
Glucose, Bld: 113 mg/dL — ABNORMAL HIGH (ref 70–99)
Potassium: 4 mmol/L (ref 3.5–5.1)
Sodium: 135 mmol/L (ref 135–145)

## 2020-09-13 LAB — RPR: RPR Ser Ql: NONREACTIVE

## 2020-09-13 MED ORDER — SODIUM CHLORIDE 0.9 % IV BOLUS
1000.0000 mL | Freq: Once | INTRAVENOUS | Status: AC
  Administered 2020-09-13: 1000 mL via INTRAVENOUS

## 2020-09-13 MED ORDER — ZOLPIDEM TARTRATE 5 MG PO TABS: 5.0000 mg | ORAL_TABLET | Freq: Every evening | ORAL | Status: DC | PRN

## 2020-09-13 MED ORDER — ALUM & MAG HYDROXIDE-SIMETH 200-200-20 MG/5ML PO SUSP: 15.0000 mL | Freq: Four times a day (QID) | ORAL | Status: DC | PRN

## 2020-09-13 MED ORDER — PRENATAL MULTIVITAMIN CH
1.0000 | ORAL_TABLET | Freq: Every day | ORAL | Status: DC
  Administered 2020-09-13 – 2020-09-14 (×2): 1 via ORAL
  Filled 2020-09-13 (×2): qty 1

## 2020-09-13 NOTE — Progress Notes (Signed)
Patient ID: Christina Willis, female   DOB: 07-02-1996, 24 y.o.   MRN: [id number] ACULTY PRACTICE ANTEPARTUM COMPREHENSIVE PROGRESS NOTE  Azaiah Mello is a 24 y.o. G3P2002 at [redacted]w[redacted]d  who is admitted for Pyelonephritis.   Fetal presentation is cephalic. Length of Stay:  1  Days  Subjective: Pt reports feeling some better this morning. Still with some right back pain.  Patient reports good fetal movement.  She reports occ uterine contractions, no bleeding and no loss of fluid per vagina.  Vitals:  Blood pressure (!) 106/57, pulse (!) 110, temperature 98.8 F (37.1 C), temperature source Oral, resp. rate 18, height 5\' 5"  (1.651 m), weight 101.7 kg, last menstrual period 12/18/2019, SpO2 99 %, unknown if currently breastfeeding. Physical Examination: Lungs clear  Heart RRR Abd soft + BS gravid Back right CVA tenderness Ext non tender  Fetal Monitoring:  140-150's, + accels, no decels, occ ut ctx  Labs:  Results for orders placed or performed during the hospital encounter of 09/12/20 (from the past 24 hour(s))  Urinalysis, Routine w reflex microscopic   Collection Time: 09/12/20  1:59 PM  Result Value Ref Range   Color, Urine YELLOW YELLOW   APPearance CLOUDY (A) CLEAR   Specific Gravity, Urine 1.014 1.005 - 1.030   pH 6.0 5.0 - 8.0   Glucose, UA NEGATIVE NEGATIVE mg/dL   Hgb urine dipstick NEGATIVE NEGATIVE   Bilirubin Urine NEGATIVE NEGATIVE   Ketones, ur 5 (A) NEGATIVE mg/dL   Protein, ur 30 (A) NEGATIVE mg/dL   Nitrite POSITIVE (A) NEGATIVE   Leukocytes,Ua LARGE (A) NEGATIVE   RBC / HPF 0-5 0 - 5 RBC/hpf   WBC, UA >50 (H) 0 - 5 WBC/hpf   Bacteria, UA MANY (A) NONE SEEN   Squamous Epithelial / LPF 0-5 0 - 5   WBC Clumps PRESENT    Mucus PRESENT   Resp Panel by RT-PCR (Flu A&B, Covid) Vaginal/Rectal   Collection Time: 09/12/20  2:10 PM   Specimen: Vaginal/Rectal; Nasopharyngeal(NP) swabs in vial transport medium  Result Value Ref Range   SARS Coronavirus 2 by RT PCR  NEGATIVE NEGATIVE   Influenza A by PCR NEGATIVE NEGATIVE   Influenza B by PCR NEGATIVE NEGATIVE  CBC   Collection Time: 09/12/20  2:37 PM  Result Value Ref Range   WBC 14.0 (H) 4.0 - 10.5 K/uL   RBC 4.15 3.87 - 5.11 MIL/uL   Hemoglobin 11.3 (L) 12.0 - 15.0 g/dL   HCT 09/14/20 47.6 - 54.6 %   MCV 87.5 80.0 - 100.0 fL   MCH 27.2 26.0 - 34.0 pg   MCHC 31.1 30.0 - 36.0 g/dL   RDW 50.3 54.6 - 56.8 %   Platelets 195 150 - 400 K/uL   nRBC 0.0 0.0 - 0.2 %  Comprehensive metabolic panel   Collection Time: 09/12/20  2:37 PM  Result Value Ref Range   Sodium 136 135 - 145 mmol/L   Potassium 4.4 3.5 - 5.1 mmol/L   Chloride 102 98 - 111 mmol/L   CO2 22 22 - 32 mmol/L   Glucose, Bld 88 70 - 99 mg/dL   BUN 7 6 - 20 mg/dL   Creatinine, Ser 09/14/20 0.44 - 1.00 mg/dL   Calcium 9.1 8.9 - 5.17 mg/dL   Total Protein 6.3 (L) 6.5 - 8.1 g/dL   Albumin 3.0 (L) 3.5 - 5.0 g/dL   AST 19 15 - 41 U/L   ALT 19 0 - 44 U/L   Alkaline Phosphatase 115  38 - 126 U/L   Total Bilirubin 0.6 0.3 - 1.2 mg/dL   GFR, Estimated >40 >97 mL/min   Anion gap 12 5 - 15  Type and screen [facility]   Collection Time: 09/12/20  2:37 PM  Result Value Ref Range   ABO/RH(D) O NEG    Antibody Screen POS    Sample Expiration 09/15/2020,2359    Antibody Identification      PASSIVELY ACQUIRED ANTI-D Performed at [facility], [address] [place], [place] [address]   Culture, blood (routine x 2)   Collection Time: 09/12/20  4:29 PM   Specimen: BLOOD  Result Value Ref Range   Specimen Description BLOOD RIGHT ANTECUBITAL    Special Requests      BOTTLES DRAWN AEROBIC AND ANAEROBIC Blood Culture adequate volume   Culture  Setup Time      ANAEROBIC BOTTLE ONLY GRAM NEGATIVE RODS CRITICAL VALUE NOTED.  VALUE IS CONSISTENT WITH PREVIOUSLY REPORTED AND CALLED VALUE. Performed at [facility], [address], [place], [place] [address]    Culture PENDING    Report Status PENDING   Culture, blood  (routine x 2)   Collection Time: 09/12/20  4:32 PM   Specimen: BLOOD  Result Value Ref Range   Specimen Description BLOOD LEFT ANTECUBITAL    Special Requests      BOTTLES DRAWN AEROBIC AND ANAEROBIC Blood Culture adequate volume   Culture  Setup Time      GRAM NEGATIVE RODS IN BOTH AEROBIC AND ANAEROBIC BOTTLES CRITICAL RESULT CALLED TO, READ BACK BY AND VERIFIED WITH: Clyde Lundborg [facility] 09/13/20 0715 JDW Performed at [facility], [address], [place], [place] [address]    Culture GRAM NEGATIVE RODS    Report Status PENDING   Blood Culture ID Panel (Reflexed)   Collection Time: 09/12/20  4:32 PM  Result Value Ref Range   Enterococcus faecalis NOT DETECTED NOT DETECTED   Enterococcus Faecium NOT DETECTED NOT DETECTED   Listeria monocytogenes NOT DETECTED NOT DETECTED   Staphylococcus species NOT DETECTED NOT DETECTED   Staphylococcus aureus (BCID) NOT DETECTED NOT DETECTED   Staphylococcus epidermidis NOT DETECTED NOT DETECTED   Staphylococcus lugdunensis NOT DETECTED NOT DETECTED   Streptococcus species NOT DETECTED NOT DETECTED   Streptococcus agalactiae NOT DETECTED NOT DETECTED   Streptococcus pneumoniae NOT DETECTED NOT DETECTED   Streptococcus pyogenes NOT DETECTED NOT DETECTED   A.calcoaceticus-baumannii NOT DETECTED NOT DETECTED   Bacteroides fragilis NOT DETECTED NOT DETECTED   Enterobacterales DETECTED (A) NOT DETECTED   Enterobacter cloacae complex NOT DETECTED NOT DETECTED   Escherichia coli DETECTED (A) NOT DETECTED   Klebsiella aerogenes NOT DETECTED NOT DETECTED   Klebsiella oxytoca NOT DETECTED NOT DETECTED   Klebsiella pneumoniae NOT DETECTED NOT DETECTED   Proteus species NOT DETECTED NOT DETECTED   Salmonella species NOT DETECTED NOT DETECTED   Serratia marcescens NOT DETECTED NOT DETECTED   Haemophilus influenzae NOT DETECTED NOT DETECTED   Neisseria meningitidis NOT DETECTED NOT DETECTED   Pseudomonas aeruginosa NOT DETECTED NOT DETECTED    Stenotrophomonas maltophilia NOT DETECTED NOT DETECTED   Candida albicans NOT DETECTED NOT DETECTED   Candida auris NOT DETECTED NOT DETECTED   Candida glabrata NOT DETECTED NOT DETECTED   Candida krusei NOT DETECTED NOT DETECTED   Candida parapsilosis NOT DETECTED NOT DETECTED   Candida tropicalis NOT DETECTED NOT DETECTED   Cryptococcus neoformans/gattii NOT DETECTED NOT DETECTED   CTX-M ESBL NOT DETECTED  NOT DETECTED   Carbapenem resistance IMP NOT DETECTED NOT DETECTED   Carbapenem resistance KPC NOT DETECTED NOT DETECTED   Carbapenem resistance NDM NOT DETECTED NOT DETECTED   Carbapenem resist OXA 48 LIKE NOT DETECTED NOT DETECTED   Carbapenem resistance VIM NOT DETECTED NOT DETECTED  Lactic acid, plasma   Collection Time: 09/12/20  6:12 PM  Result Value Ref Range   Lactic Acid, Venous 1.8 0.5 - 1.9 mmol/L  CBC   Collection Time: 09/13/20  5:41 AM  Result Value Ref Range   WBC 12.6 (H) 4.0 - 10.5 K/uL   RBC 3.79 (L) 3.87 - 5.11 MIL/uL   Hemoglobin 10.3 (L) 12.0 - 15.0 g/dL   HCT 63.8 (L) 75.6 - 43.3 %   MCV 88.1 80.0 - 100.0 fL   MCH 27.2 26.0 - 34.0 pg   MCHC 30.8 30.0 - 36.0 g/dL   RDW 29.5 (H) 18.8 - 41.6 %   Platelets 194 150 - 400 K/uL   nRBC 0.0 0.0 - 0.2 %  Basic metabolic panel   Collection Time: 09/13/20  5:41 AM  Result Value Ref Range   Sodium 135 135 - 145 mmol/L   Potassium 4.0 3.5 - 5.1 mmol/L   Chloride 105 98 - 111 mmol/L   CO2 22 22 - 32 mmol/L   Glucose, Bld 113 (H) 70 - 99 mg/dL   BUN 9 6 - 20 mg/dL   Creatinine, Ser 6.06 0.44 - 1.00 mg/dL   Calcium 8.6 (L) 8.9 - 10.3 mg/dL   GFR, Estimated >30 >16 mL/min   Anion gap 8 5 - 15    Imaging Studies:    NA   Medications:  Scheduled . prenatal multivitamin  1 tablet Oral Q1200   I have reviewed the patient's current medications.  ASSESSMENT: IUP 38 4/7 weeks Pyelonephritis + Blood culture   PLAN: Stable. Will continue with Cefepime. Await sensitives. Fetal status reassuring. Continue  routine antenatal care.   Hermina Staggers 09/13/2020,9:43 AM

## 2020-09-13 NOTE — Progress Notes (Signed)
PHARMACY - PHYSICIAN COMMUNICATION CRITICAL VALUE ALERT - BLOOD CULTURE IDENTIFICATION (BCID)  Gayna Braddy is an 24 y.o. female who presented to [facility] on 09/12/2020 with a chief complaint of UTI  Assessment:  Patient has 2/4 bottles growing E.coli with no resistances likely from the ongoing UTI. She was started on Cefepime 12/21 which will cover this bacteria.  Name of physician (or Provider) Contacted: Catalina Antigua, MD  Current antibiotics: Cefepime 2gm q8h  Changes to prescribed antibiotics recommended:  Patient is on recommended antibiotics - No changes needed  Results for orders placed or performed during the hospital encounter of 09/12/20  Blood Culture ID Panel (Reflexed) (Collected: 09/12/2020  4:32 PM)  Result Value Ref Range   Enterococcus faecalis NOT DETECTED NOT DETECTED   Enterococcus Faecium NOT DETECTED NOT DETECTED   Listeria monocytogenes NOT DETECTED NOT DETECTED   Staphylococcus species NOT DETECTED NOT DETECTED   Staphylococcus aureus (BCID) NOT DETECTED NOT DETECTED   Staphylococcus epidermidis NOT DETECTED NOT DETECTED   Staphylococcus lugdunensis NOT DETECTED NOT DETECTED   Streptococcus species NOT DETECTED NOT DETECTED   Streptococcus agalactiae NOT DETECTED NOT DETECTED   Streptococcus pneumoniae NOT DETECTED NOT DETECTED   Streptococcus pyogenes NOT DETECTED NOT DETECTED   A.calcoaceticus-baumannii NOT DETECTED NOT DETECTED   Bacteroides fragilis NOT DETECTED NOT DETECTED   Enterobacterales DETECTED (A) NOT DETECTED   Enterobacter cloacae complex NOT DETECTED NOT DETECTED   Escherichia coli DETECTED (A) NOT DETECTED   Klebsiella aerogenes NOT DETECTED NOT DETECTED   Klebsiella oxytoca NOT DETECTED NOT DETECTED   Klebsiella pneumoniae NOT DETECTED NOT DETECTED   Proteus species NOT DETECTED NOT DETECTED   Salmonella species NOT DETECTED NOT DETECTED   Serratia marcescens NOT DETECTED NOT DETECTED   Haemophilus influenzae NOT DETECTED NOT  DETECTED   Neisseria meningitidis NOT DETECTED NOT DETECTED   Pseudomonas aeruginosa NOT DETECTED NOT DETECTED   Stenotrophomonas maltophilia NOT DETECTED NOT DETECTED   Candida albicans NOT DETECTED NOT DETECTED   Candida auris NOT DETECTED NOT DETECTED   Candida glabrata NOT DETECTED NOT DETECTED   Candida krusei NOT DETECTED NOT DETECTED   Candida parapsilosis NOT DETECTED NOT DETECTED   Candida tropicalis NOT DETECTED NOT DETECTED   Cryptococcus neoformans/gattii NOT DETECTED NOT DETECTED   CTX-M ESBL NOT DETECTED NOT DETECTED   Carbapenem resistance IMP NOT DETECTED NOT DETECTED   Carbapenem resistance KPC NOT DETECTED NOT DETECTED   Carbapenem resistance NDM NOT DETECTED NOT DETECTED   Carbapenem resist OXA 48 LIKE NOT DETECTED NOT DETECTED   Carbapenem resistance VIM NOT DETECTED NOT DETECTED    Minda Meo 09/13/2020  7:23 AM

## 2020-09-14 LAB — CULTURE, OB URINE: Culture: >=100000 — AB

## 2020-09-14 MED ORDER — CEFAZOLIN SODIUM-DEXTROSE 2-4 GM/100ML-% IV SOLN
2.0000 g | Freq: Three times a day (TID) | INTRAVENOUS | Status: DC
  Administered 2020-09-14 – 2020-09-15 (×3): 2 g via INTRAVENOUS
  Filled 2020-09-14 (×4): qty 100

## 2020-09-14 NOTE — Progress Notes (Signed)
ACULTY PRACTICE ANTEPARTUM COMPREHENSIVE PROGRESS NOTE  Christina Willis is a 24 y.o. G3P2002 at [redacted]w[redacted]d  who is admitted for Pyelonephritis.   Fetal presentation is cephalic. Length of Stay:  2  Days  Subjective: Pt reports feeling much better today. Back pain almost completely resolved Patient reports good fetal movement.  She reports no uterine contractions, no bleeding and no loss of fluid per vagina.  Vitals:  Blood pressure 103/69, pulse (!) 115, temperature 98.5 F (36.9 C), temperature source Oral, resp. rate 18, height 5\' 5"  (1.651 m), weight 101.7 kg, last menstrual period 12/18/2019, SpO2 99 %, unknown if currently breastfeeding. Physical Examination: Lungs clear Heart RRR Abd soft + BS gravid Back min right CVA tenderness  Fetal Monitoring:  140-150's, + accels, no decles, occ ut ctx  Labs:  No results found for this or any previous visit (from the past 24 hour(s)).  Imaging Studies:    NA   Medications:  Scheduled . prenatal multivitamin  1 tablet Oral Q1200   I have reviewed the patient's current medications.  ASSESSMENT: IUP 38 5/7 weeks Pyelonephritis Bacteremia  PLAN: Continues to improve. Remains afebrile. Awaiting sensitives. Will switch to oral antibiotic once they return. Hopefully will be able to discharge Continue routine antenatal care.   7/7 09/14/2020,10:50 AM

## 2020-09-15 LAB — CULTURE, BLOOD (ROUTINE X 2)
Special Requests: ADEQUATE
Special Requests: ADEQUATE

## 2020-09-15 MED ORDER — AMOXICILLIN-POT CLAVULANATE 875-125 MG PO TABS
1.0000 | ORAL_TABLET | Freq: Two times a day (BID) | ORAL | Status: DC
  Administered 2020-09-15: 1 via ORAL
  Filled 2020-09-15: qty 1

## 2020-09-15 MED ORDER — AMOXICILLIN-POT CLAVULANATE 875-125 MG PO TABS: 1.0000 | ORAL_TABLET | Freq: Two times a day (BID) | ORAL | 0 refills | Status: DC

## 2020-09-15 NOTE — Progress Notes (Signed)
ACULTY PRACTICE ANTEPARTUM COMPREHENSIVE PROGRESS NOTE  Christina Willis is a 24 y.o. G3P2002 at [redacted]w[redacted]d  who is admitted for Pyelonephritis.   Fetal presentation is cephalic. Length of Stay:  3  Days  Subjective: Pt reports feeling much better today. Back pain minimal, denies subjective fever chills, n/v. Patient reports good fetal movement.  She reports no uterine contractions, no bleeding and no loss of fluid per vagina.  Vitals:  Blood pressure 113/62, pulse 93, temperature 97.9 F (36.6 C), temperature source Oral, resp. rate 18, height 5\' 5"  (1.651 m), weight 101.7 kg, last menstrual period 12/18/2019, SpO2 99 %, unknown if currently breastfeeding. Physical Examination: Lungs clear Heart RRR Abd soft + BS gravid Back no CVA right CVA tenderness  Fetal Monitoring:  140-150's, + accels, no decles, occ ut ctx  Labs:  No results found for this or any previous visit (from the past 24 hour(s)).  Imaging Studies:    NA   Medications:  Scheduled . amoxicillin-clavulanate  1 tablet Oral Q12H  . prenatal multivitamin  1 tablet Oral Q1200   I have reviewed the patient's current medications.  ASSESSMENT: IUP 38 6/7 weeks Pyelonephritis Bacteremia  PLAN: Continues to improve. Remains afebrile.  Urine Cx E.coli, sensitivities reviewed. Will switch to oral Augmentin.  Discharge today.     8/7 09/15/2020,7:44 AM

## 2020-09-15 NOTE — Discharge Summary (Signed)
Physician Discharge Summary  Patient ID: Christina Willis MRN: [id number] DOB/AGE: [birth year] 23 y.o.  Admit date: 09/12/2020 Discharge date: 09/15/2020  Admission Diagnoses:  Discharge Diagnoses:  Principal Problem:   Pyelonephritis affecting pregnancy in third trimester   Discharged Condition: good  Hospital Course: Pt was admitted with right flank pain and early labor with concern for pyelonephritis. She had a recent UTI that was untreated. She received IV abx and transitioned to PO abx on HOD3. With pain improved and no fever for >48hrs she was deemed stable for discharge. Regarding FWB NST were reactive through hospital course.   Consults: None  Significant Diagnostic Studies: microbiology: urine culture: positive for e. coli.   Treatments: IV hydration and antibiotics: ceftriaxone and Augmentin   Discharge Exam: Blood pressure 132/72, pulse (!) 106, temperature 98.4 F (36.9 C), temperature source Oral, resp. rate 18, height 5\' 5"  (1.651 m), weight 101.7 kg, last menstrual period 12/18/2019, SpO2 100 %, unknown if currently breastfeeding. General appearance: alert and cooperative Head: Normocephalic, without obvious abnormality, atraumatic Back: symmetric, no curvature. ROM normal. No CVA tenderness. Resp: clear to auscultation bilaterally GI: soft, non-tender; bowel sounds normal; no masses,  no organomegaly Extremities: extremities normal, atraumatic, no cyanosis or edema Skin: Skin color, texture, turgor normal. No rashes or lesions  Disposition: Discharge disposition: 01-Home or Self Care       Discharge Instructions    Discharge activity:  No Restrictions   Complete by: As directed    Discharge activity:  Up to eat   Complete by: As directed    Discharge diet:  No restrictions   Complete by: As directed    Fetal Kick Count:  Lie on our left side for one hour after a meal, and count the number of times your baby kicks.  If it is less than 5 times, get up,  move around and drink some juice.  Repeat the test 30 minutes later.  If it is still less than 5 kicks in an hour, notify your doctor.   Complete by: As directed    No sexual activity restrictions   Complete by: As directed      Allergies as of 09/15/2020      Reactions   Apple Itching   Banana Itching   Peanut-containing Drug Products Swelling   Raw peanuts      Medication List    STOP taking these medications   ketoconazole 2 % cream Commonly known as: NIZORAL   metroNIDAZOLE 500 MG tablet Commonly known as: Flagyl   nitrofurantoin (macrocrystal-monohydrate) 100 MG capsule Commonly known as: MACROBID     TAKE these medications   albuterol 108 (90 Base) MCG/ACT inhaler Commonly known as: VENTOLIN HFA Inhale 2 puffs into the lungs every 4 (four) hours as needed for wheezing or shortness of breath (asthma). Reported on 04/04/2016   amoxicillin-clavulanate 875-125 MG tablet Commonly known as: AUGMENTIN Take 1 tablet by mouth 2 (two) times daily for 10 days.        Signed: 04/06/2016 09/15/2020, 9:43 AM

## 2020-09-21 ENCOUNTER — Encounter (HOSPITAL_COMMUNITY): Payer: Self-pay | Admitting: Family Medicine

## 2020-09-21 ENCOUNTER — Other Ambulatory Visit: Payer: Self-pay

## 2020-09-21 ENCOUNTER — Inpatient Hospital Stay (HOSPITAL_COMMUNITY): Payer: Medicaid Other | Admitting: Anesthesiology

## 2020-09-21 ENCOUNTER — Inpatient Hospital Stay (HOSPITAL_COMMUNITY)
Admission: AD | Admit: 2020-09-21 | Discharge: 2020-09-23 | DRG: 807 | Disposition: A | Discharge status: Home / Self Care | Payer: Medicaid Other | Attending: Obstetrics & Gynecology | Admitting: Obstetrics & Gynecology

## 2020-09-21 DIAGNOSIS — O4202 Full-term premature rupture of membranes, onset of labor within 24 hours of rupture: Secondary | ICD-10-CM

## 2020-09-21 DIAGNOSIS — O26899 Other specified pregnancy related conditions, unspecified trimester: Secondary | ICD-10-CM

## 2020-09-21 DIAGNOSIS — O368390 Maternal care for abnormalities of the fetal heart rate or rhythm, unspecified trimester, not applicable or unspecified: Secondary | ICD-10-CM | POA: Diagnosis present

## 2020-09-21 DIAGNOSIS — O99820 Streptococcus B carrier state complicating pregnancy: Secondary | ICD-10-CM

## 2020-09-21 DIAGNOSIS — Z3A39 39 weeks gestation of pregnancy: Secondary | ICD-10-CM

## 2020-09-21 DIAGNOSIS — Z8759 Personal history of other complications of pregnancy, childbirth and the puerperium: Secondary | ICD-10-CM

## 2020-09-21 DIAGNOSIS — D563 Thalassemia minor: Secondary | ICD-10-CM | POA: Diagnosis present

## 2020-09-21 DIAGNOSIS — Z20822 Contact with and (suspected) exposure to covid-19: Secondary | ICD-10-CM | POA: Diagnosis present

## 2020-09-21 DIAGNOSIS — Z6791 Unspecified blood type, Rh negative: Secondary | ICD-10-CM | POA: Diagnosis not present

## 2020-09-21 DIAGNOSIS — Z3492 Encounter for supervision of normal pregnancy, unspecified, second trimester: Secondary | ICD-10-CM

## 2020-09-21 DIAGNOSIS — O26893 Other specified pregnancy related conditions, third trimester: Secondary | ICD-10-CM | POA: Diagnosis present

## 2020-09-21 DIAGNOSIS — O2303 Infections of kidney in pregnancy, third trimester: Secondary | ICD-10-CM | POA: Diagnosis present

## 2020-09-21 HISTORY — DX: Streptococcus B carrier state complicating pregnancy: O99.820

## 2020-09-21 LAB — CBC
HCT: 36.2 % (ref 36.0–46.0)
Hemoglobin: 12.2 g/dL (ref 12.0–15.0)
MCH: 28.9 pg (ref 26.0–34.0)
MCHC: 33.7 g/dL (ref 30.0–36.0)
MCV: 85.8 fL (ref 80.0–100.0)
Platelets: 319 10*3/uL (ref 150–400)
RBC: 4.22 MIL/uL (ref 3.87–5.11)
RDW: 15.1 % (ref 11.5–15.5)
WBC: 9 10*3/uL (ref 4.0–10.5)
nRBC: 0 % (ref 0.0–0.2)

## 2020-09-21 LAB — TYPE AND SCREEN
ABO/RH(D): O NEG
Antibody Screen: POSITIVE

## 2020-09-21 LAB — RESP PANEL BY RT-PCR (FLU A&B, COVID) ARPGX2
Influenza A by PCR: NEGATIVE
Influenza B by PCR: NEGATIVE
SARS Coronavirus 2 by RT PCR: NEGATIVE

## 2020-09-21 MED ORDER — METHYLERGONOVINE MALEATE 0.2 MG/ML IJ SOLN: 0.2000 mg | INTRAMUSCULAR | Status: DC | PRN

## 2020-09-21 MED ORDER — PENICILLIN G POT IN DEXTROSE 60000 UNIT/ML IV SOLN
3.0000 10*6.[IU] | INTRAVENOUS | Status: DC
  Administered 2020-09-21: 3 10*6.[IU] via INTRAVENOUS
  Filled 2020-09-21: qty 50

## 2020-09-21 MED ORDER — SODIUM CHLORIDE 0.9 % IV SOLN
5.0000 10*6.[IU] | Freq: Once | INTRAVENOUS | Status: AC
  Administered 2020-09-21: 5 10*6.[IU] via INTRAVENOUS
  Filled 2020-09-21: qty 5

## 2020-09-21 MED ORDER — TERBUTALINE SULFATE 1 MG/ML IJ SOLN: 0.2500 mg | Freq: Once | INTRAMUSCULAR | Status: DC | PRN

## 2020-09-21 MED ORDER — OXYCODONE-ACETAMINOPHEN 5-325 MG PO TABS
1.0000 | ORAL_TABLET | ORAL | Status: DC | PRN
Start: 2020-09-21 — End: 2020-09-21

## 2020-09-21 MED ORDER — SODIUM CHLORIDE (PF) 0.9 % IJ SOLN
INTRAMUSCULAR | Status: DC | PRN
  Administered 2020-09-21: 12 mL/h via EPIDURAL

## 2020-09-21 MED ORDER — ONDANSETRON HCL 4 MG PO TABS: 4.0000 mg | ORAL_TABLET | ORAL | Status: DC | PRN

## 2020-09-21 MED ORDER — LACTATED RINGERS IV SOLN: INTRAVENOUS | Status: DC

## 2020-09-21 MED ORDER — WITCH HAZEL-GLYCERIN EX PADS: 1.0000 "application " | MEDICATED_PAD | CUTANEOUS | Status: DC | PRN

## 2020-09-21 MED ORDER — ACETAMINOPHEN 325 MG PO TABS
650.0000 mg | ORAL_TABLET | ORAL | Status: DC | PRN
  Administered 2020-09-22 (×2): 650 mg via ORAL
  Filled 2020-09-21 (×2): qty 2

## 2020-09-21 MED ORDER — OXYTOCIN BOLUS FROM INFUSION
333.0000 mL | Freq: Once | INTRAVENOUS | Status: AC
  Administered 2020-09-21: 333 mL via INTRAVENOUS

## 2020-09-21 MED ORDER — COCONUT OIL OIL: 1.0000 "application " | TOPICAL_OIL | Status: DC | PRN

## 2020-09-21 MED ORDER — PHENYLEPHRINE 40 MCG/ML (10ML) SYRINGE FOR IV PUSH (FOR BLOOD PRESSURE SUPPORT): 80.0000 ug | PREFILLED_SYRINGE | INTRAVENOUS | Status: DC | PRN

## 2020-09-21 MED ORDER — MEDROXYPROGESTERONE ACETATE 150 MG/ML IM SUSP: 150.0000 mg | INTRAMUSCULAR | Status: DC | PRN

## 2020-09-21 MED ORDER — FLEET ENEMA 7-19 GM/118ML RE ENEM: 1.0000 | ENEMA | RECTAL | Status: DC | PRN

## 2020-09-21 MED ORDER — ONDANSETRON HCL 4 MG/2ML IJ SOLN: 4.0000 mg | Freq: Four times a day (QID) | INTRAMUSCULAR | Status: DC | PRN

## 2020-09-21 MED ORDER — DOCUSATE SODIUM 100 MG PO CAPS
100.0000 mg | ORAL_CAPSULE | Freq: Two times a day (BID) | ORAL | Status: DC
  Administered 2020-09-22 – 2020-09-23 (×3): 100 mg via ORAL
  Filled 2020-09-21 (×3): qty 1

## 2020-09-21 MED ORDER — PENICILLIN G POT IN DEXTROSE 60000 UNIT/ML IV SOLN: 3.0000 10*6.[IU] | INTRAVENOUS | Status: DC

## 2020-09-21 MED ORDER — IBUPROFEN 600 MG PO TABS
600.0000 mg | ORAL_TABLET | Freq: Four times a day (QID) | ORAL | Status: DC
  Administered 2020-09-21 – 2020-09-23 (×7): 600 mg via ORAL
  Filled 2020-09-21 (×7): qty 1

## 2020-09-21 MED ORDER — OXYCODONE-ACETAMINOPHEN 5-325 MG PO TABS
2.0000 | ORAL_TABLET | ORAL | Status: DC | PRN
Start: 2020-09-21 — End: 2020-09-21

## 2020-09-21 MED ORDER — ACETAMINOPHEN 325 MG PO TABS: 650.0000 mg | ORAL_TABLET | ORAL | Status: DC | PRN

## 2020-09-21 MED ORDER — SODIUM CHLORIDE 0.9 % IV SOLN: 5.0000 10*6.[IU] | Freq: Once | INTRAVENOUS | Status: DC

## 2020-09-21 MED ORDER — DIPHENHYDRAMINE HCL 50 MG/ML IJ SOLN: 12.5000 mg | INTRAMUSCULAR | Status: DC | PRN

## 2020-09-21 MED ORDER — OXYTOCIN-SODIUM CHLORIDE 30-0.9 UT/500ML-% IV SOLN: 2.5000 [IU]/h | INTRAVENOUS | Status: DC

## 2020-09-21 MED ORDER — METHYLERGONOVINE MALEATE 0.2 MG PO TABS: 0.2000 mg | ORAL_TABLET | ORAL | Status: DC | PRN

## 2020-09-21 MED ORDER — PRENATAL MULTIVITAMIN CH
1.0000 | ORAL_TABLET | Freq: Every day | ORAL | Status: DC
  Administered 2020-09-22 – 2020-09-23 (×2): 1 via ORAL
  Filled 2020-09-21 (×2): qty 1

## 2020-09-21 MED ORDER — SOD CITRATE-CITRIC ACID 500-334 MG/5ML PO SOLN: 30.0000 mL | ORAL | Status: DC | PRN

## 2020-09-21 MED ORDER — ONDANSETRON HCL 4 MG/2ML IJ SOLN: 4.0000 mg | INTRAMUSCULAR | Status: DC | PRN

## 2020-09-21 MED ORDER — FLEET ENEMA 7-19 GM/118ML RE ENEM: 1.0000 | ENEMA | Freq: Every day | RECTAL | Status: DC | PRN

## 2020-09-21 MED ORDER — LIDOCAINE-EPINEPHRINE (PF) 2 %-1:200000 IJ SOLN
INTRAMUSCULAR | Status: DC | PRN
  Administered 2020-09-21: 5 mL via EPIDURAL

## 2020-09-21 MED ORDER — DIPHENHYDRAMINE HCL 25 MG PO CAPS: 25.0000 mg | ORAL_CAPSULE | Freq: Four times a day (QID) | ORAL | Status: DC | PRN

## 2020-09-21 MED ORDER — LIDOCAINE HCL (PF) 1 % IJ SOLN: 30.0000 mL | INTRAMUSCULAR | Status: DC | PRN

## 2020-09-21 MED ORDER — FERROUS SULFATE 325 (65 FE) MG PO TABS
325.0000 mg | ORAL_TABLET | ORAL | Status: DC
  Administered 2020-09-22: 325 mg via ORAL
  Filled 2020-09-21: qty 1

## 2020-09-21 MED ORDER — TETANUS-DIPHTH-ACELL PERTUSSIS 5-2.5-18.5 LF-MCG/0.5 IM SUSY: 0.5000 mL | PREFILLED_SYRINGE | Freq: Once | INTRAMUSCULAR | Status: DC

## 2020-09-21 MED ORDER — EPHEDRINE 5 MG/ML INJ: 10.0000 mg | INTRAVENOUS | Status: DC | PRN

## 2020-09-21 MED ORDER — OXYTOCIN-SODIUM CHLORIDE 30-0.9 UT/500ML-% IV SOLN
1.0000 m[IU]/min | INTRAVENOUS | Status: DC
  Administered 2020-09-21: 2 m[IU]/min via INTRAVENOUS
  Filled 2020-09-21: qty 500

## 2020-09-21 MED ORDER — DIBUCAINE (PERIANAL) 1 % EX OINT: 1.0000 "application " | TOPICAL_OINTMENT | CUTANEOUS | Status: DC | PRN

## 2020-09-21 MED ORDER — FENTANYL-BUPIVACAINE-NACL 0.5-0.125-0.9 MG/250ML-% EP SOLN
12.0000 mL/h | EPIDURAL | Status: DC | PRN
  Filled 2020-09-21: qty 250

## 2020-09-21 MED ORDER — BISACODYL 10 MG RE SUPP: 10.0000 mg | Freq: Every day | RECTAL | Status: DC | PRN

## 2020-09-21 MED ORDER — LACTATED RINGERS IV SOLN
500.0000 mL | Freq: Once | INTRAVENOUS | Status: AC
  Administered 2020-09-21: 500 mL via INTRAVENOUS

## 2020-09-21 MED ORDER — LACTATED RINGERS IV SOLN
500.0000 mL | INTRAVENOUS | Status: DC | PRN
  Administered 2020-09-21 (×2): 500 mL via INTRAVENOUS

## 2020-09-21 MED ORDER — BENZOCAINE-MENTHOL 20-0.5 % EX AERO
1.0000 "application " | INHALATION_SPRAY | CUTANEOUS | Status: DC | PRN
  Administered 2020-09-22: 1 via TOPICAL
  Filled 2020-09-21: qty 56

## 2020-09-21 MED ORDER — MEASLES, MUMPS & RUBELLA VAC IJ SOLR: 0.5000 mL | Freq: Once | INTRAMUSCULAR | Status: DC

## 2020-09-21 MED ORDER — SIMETHICONE 80 MG PO CHEW: 80.0000 mg | CHEWABLE_TABLET | ORAL | Status: DC | PRN

## 2020-09-21 NOTE — Anesthesia Preprocedure Evaluation (Signed)
Anesthesia Evaluation  Patient identified by MRN, date of birth, ID band Patient awake    Reviewed: Allergy & Precautions, NPO status , Patient's Chart, lab work & pertinent test results  Airway Mallampati: II  TM Distance: >3 FB Neck ROM: Full    Dental no notable dental hx.    Pulmonary asthma ,    Pulmonary exam normal breath sounds clear to auscultation       Cardiovascular negative cardio ROS Normal cardiovascular exam Rhythm:Regular Rate:Normal     Neuro/Psych negative neurological ROS  negative psych ROS   GI/Hepatic negative GI ROS, Neg liver ROS,   Endo/Other  negative endocrine ROSObese BMI 38  Renal/GU negative Renal ROS  negative genitourinary   Musculoskeletal negative musculoskeletal ROS (+)   Abdominal   Peds  Hematology negative hematology ROS (+)   Anesthesia Other Findings   Reproductive/Obstetrics (+) Pregnancy                             Anesthesia Physical Anesthesia Plan  ASA: II  Anesthesia Plan: Epidural   Post-op Pain Management:    Induction:   PONV Risk Score and Plan: Treatment may vary due to age or medical condition  Airway Management Planned: Natural Airway  Additional Equipment:   Intra-op Plan:   Post-operative Plan:   Informed Consent: I have reviewed the patients History and Physical, chart, labs and discussed the procedure including the risks, benefits and alternatives for the proposed anesthesia with the patient or authorized representative who has indicated his/her understanding and acceptance.       Plan Discussed with: Anesthesiologist  Anesthesia Plan Comments: (Patient identified. Risks, benefits, options discussed with patient including but not limited to bleeding, infection, nerve damage, paralysis, failed block, incomplete pain control, headache, blood pressure changes, nausea, vomiting, reactions to medication, itching, and  post partum back pain. Confirmed with bedside nurse the patient's most recent platelet count. Confirmed with the patient that they are not taking any anticoagulation, have any bleeding history or any family history of bleeding disorders. Patient expressed understanding and wishes to proceed. All questions were answered. )        Anesthesia Quick Evaluation

## 2020-09-21 NOTE — Anesthesia Procedure Notes (Signed)
Epidural Patient location during procedure: OB Start time: 09/21/2020 5:00 PM End time: 09/21/2020 5:15 PM  Staffing Anesthesiologist: Elmer Picker, MD Performed: anesthesiologist   Preanesthetic Checklist Completed: patient identified, IV checked, risks and benefits discussed, monitors and equipment checked, pre-op evaluation and timeout performed  Epidural Patient position: sitting Prep: DuraPrep and site prepped and draped Patient monitoring: continuous pulse ox, blood pressure, heart rate and cardiac monitor Approach: midline Location: L3-L4 Injection technique: LOR air  Needle:  Needle type: Tuohy  Needle gauge: 17 G Needle length: 9 cm Needle insertion depth: 5 cm Catheter type: closed end flexible Catheter size: 19 Gauge Catheter at skin depth: 11 cm Test dose: negative  Assessment Sensory level: T8 Events: blood not aspirated, injection not painful, no injection resistance, no paresthesia and negative IV test  Additional Notes Patient identified. Risks/Benefits/Options discussed with patient including but not limited to bleeding, infection, nerve damage, paralysis, failed block, incomplete pain control, headache, blood pressure changes, nausea, vomiting, reactions to medication both or allergic, itching and postpartum back pain. Confirmed with bedside nurse the patient's most recent platelet count. Confirmed with patient that they are not currently taking any anticoagulation, have any bleeding history or any family history of bleeding disorders. Patient expressed understanding and wished to proceed. All questions were answered. Sterile technique was used throughout the entire procedure. Please see nursing notes for vital signs. Test dose was given through epidural catheter and negative prior to continuing to dose epidural or start infusion. Warning signs of high block given to the patient including shortness of breath, tingling/numbness in hands, complete motor block,  or any concerning symptoms with instructions to call for help. Patient was given instructions on fall risk and not to get out of bed. All questions and concerns addressed with instructions to call with any issues or inadequate analgesia.  Reason for block:procedure for pain

## 2020-09-21 NOTE — MAU Note (Signed)
Presents with c/o ctxs every 10-15 minutes. States ctxs began @ 0600 this morning.  Denies VB or LOF.  Endorses +FM.

## 2020-09-21 NOTE — Discharge Summary (Signed)
Postpartum Discharge Summary    Patient Name: Christina Willis DOB: 04/02/1996 MRN: [id number]  Date of admission: 09/21/2020 Delivery date:09/21/2020  Delivering provider: Christin Fudge  Date of discharge: 09/23/2020  Admitting diagnosis: Fetal heart rate deceleration, delivered, current hospitalization [O76] Intrauterine pregnancy: [redacted]w[redacted]d    Secondary diagnosis:  Active Problems:   Rh negative, antepartum   Pyelonephritis affecting pregnancy in third trimester   Fetal heart rate decelerations affecting management of mother   GBS (group B Streptococcus carrier), +RV culture, currently pregnant   Fetal heart rate deceleration, delivered, current hospitalization  Additional problems: none    Discharge diagnosis: Term Pregnancy Delivered                                              Post partum procedures:n/a Augmentation: Pitocin Complications: None  Hospital course: Onset of Labor With Vaginal Delivery      24y.o. yo [id number] admitted in Latent Labor on 09/21/2020. Patient had an uncomplicated labor course as follows:  Membrane Rupture Time/Date: 9:03 PM ,09/21/2020   Delivery Method:Vaginal, Spontaneous  Episiotomy: None  Lacerations:  None  Patient had an uncomplicated postpartum course.  She is ambulating, tolerating a regular diet, passing flatus, and urinating well. Patient is discharged home in stable condition on 09/23/20.  Newborn Data: Birth date:09/21/2020  Birth time:9:06 PM  Gender:Female  Living status:Living  Apgars:9 ,9  Weight:3405 g   Magnesium Sulfate received: No BMZ received: No Rhophylac:Yes MMR:N/A T-DaP:Given prenatally Flu: Yes Transfusion:No  Physical exam  Vitals:   09/22/20 1250 09/22/20 1656 09/22/20 2206 09/23/20 0604  BP: 117/76 112/75 121/70 121/74  Pulse: 78 72 90 76  Resp: _0 Temp: 97.6 F (36.4 C) 98.2 F (36.8 C) 98.2 F (36.8 C) 98 F (36.7 C)  TempSrc: Oral Oral Oral Oral  SpO2:  100% 99% 99%   Weight:      Height:       General: alert, cooperative and no distress Lochia: appropriate Uterine Fundus: firm Incision: N/A DVT Evaluation: No evidence of DVT seen on physical exam. Labs: Lab Results  Component Value Date   WBC 9.0 09/21/2020   HGB 12.2 09/21/2020   HCT 36.2 09/21/2020   MCV 85.8 09/21/2020   PLT 319 09/21/2020   CMP Latest Ref Rng & Units 09/13/2020  Glucose 70 - 99 mg/dL 113(H)  BUN 6 - 20 mg/dL 9  Creatinine 0.44 - 1.00 mg/dL 0.66  Sodium 135 - 145 mmol/L 135  Potassium 3.5 - 5.1 mmol/L 4.0  Chloride 98 - 111 mmol/L 105  CO2 22 - 32 mmol/L 22  Calcium 8.9 - 10.3 mg/dL 8.6(L)  Total Protein 6.5 - 8.1 g/dL -  Total Bilirubin 0.3 - 1.2 mg/dL -  Alkaline Phos 38 - 126 U/L -  AST 15 - 41 U/L -  ALT 0 - 44 U/L -   Edinburgh Score: Edinburgh Postnatal Depression Scale Screening Tool 09/22/2020  I have been able to laugh and see the funny side of things. 0  I have looked forward with enjoyment to things. 0  I have blamed myself unnecessarily when things went wrong. 2  I have been anxious or worried for no good reason. 0  I have felt scared or panicky for no good reason. 0  Things have been getting on top of me. 0  I have been so unhappy that I have had difficulty sleeping. 0  I have felt sad or miserable. 1  I have been so unhappy that I have been crying. 1  The thought of harming myself has occurred to me. 0  Edinburgh Postnatal Depression Scale Total 4     After visit meds:  Allergies as of 09/23/2020      Reactions   Apple Itching   Banana Itching   Peanut-containing Drug Products Swelling   Raw peanuts      Medication List    TAKE these medications   albuterol 108 (90 Base) MCG/ACT inhaler Commonly known as: VENTOLIN HFA Inhale 2 puffs into the lungs every 4 (four) hours as needed for wheezing or shortness of breath (asthma). Reported on 04/04/2016   amoxicillin-clavulanate 875-125 MG tablet Commonly known as:  AUGMENTIN Take 1 tablet by mouth 2 (two) times daily for 4 days.   ibuprofen 600 MG tablet Commonly known as: ADVIL Take 1 tablet (600 mg total) by mouth every 6 (six) hours.   prenatal multivitamin Tabs tablet Take 1 tablet by mouth daily at 12 noon.        Discharge home in stable condition Infant Feeding: Breast Infant Disposition:home with mother Discharge instruction: per After Visit Summary and Postpartum booklet. Activity: Advance as tolerated. Pelvic rest for 6 weeks.  Diet: routine diet Future Appointments:No future appointments. Follow up Visit:  Follow-up Information    Department, [facility]. Schedule an appointment as soon as possible for a visit.   Why: In 4 weeks for a postpartum appointment Contact information: [place] [address] [phone]                Please schedule this patient for a In person postpartum visit in 6 weeks with the following provider: Any provider. Additional Postpartum F/U:  Low risk pregnancy complicated by:  Delivery mode:  Vaginal, Spontaneous  Anticipated Birth Control:  Unsure   09/23/2020 Wende Mott, CNM

## 2020-09-21 NOTE — H&P (Addendum)
  Christina Willis is an 24 y.o. [phone] [redacted]w[redacted]d female.   Chief Complaint: labor HPI: contractions since 6 am. Worsening. Coming q 8 minutes. H/o SVD x 2. PNC at [facility] to care.  Past Medical History:  Diagnosis Date  . Alpha thalassemia silent carrier 06/14/2019   Aa/a- [ ]  FOB testing and Genetic counseling recommended  . Asthma     Past Surgical History:  Procedure Laterality Date  . NO PAST SURGERIES      Family History  Problem Relation Age of Onset  . Cancer Neg Hx   . Diabetes Neg Hx   . Hypertension Neg Hx    Social History:  reports that she has never smoked. She has never used smokeless tobacco. She reports that she does not drink alcohol and does not use drugs.    Allergies  Allergen Reactions  . Apple Itching  . Banana Itching  . Peanut-Containing Drug Products Swelling    Raw peanuts    Medications Prior to Admission  Medication Sig Dispense Refill  . albuterol (VENTOLIN HFA) 108 (90 Base) MCG/ACT inhaler Inhale 2 puffs into the lungs every 4 (four) hours as needed for wheezing or shortness of breath (asthma). Reported on 04/04/2016    . amoxicillin-clavulanate (AUGMENTIN) 875-125 MG tablet Take 1 tablet by mouth 2 (two) times daily for 10 days. 20 tablet 0  . Prenatal Vit-Fe Fumarate-FA (PRENATAL MULTIVITAMIN) TABS tablet Take 1 tablet by mouth daily at 12 noon.       A comprehensive review of systems was negative.  Blood pressure 124/73, pulse (!) 105, temperature 98.3 F (36.8 C), temperature source Oral, resp. rate 18, height 5\' 5"  (1.651 m), weight 102.6 kg, last menstrual period 12/18/2019, SpO2 97 %, unknown if currently breastfeeding. General appearance: alert, cooperative and appears stated age Head: Normocephalic, without obvious abnormality, atraumatic Neck: supple, symmetrical, trachea midline Lungs: normal effort Heart: regular rate and rhythm Abdomen: gravid, non-tender Extremities: edema 1+ Skin: Skin color, texture, turgor normal. No  rashes or lesions Neurologic: Grossly normal Dilation: 1.5 Effacement (%): 50 Station: -3 Presentation: Vertex Exam by:: 12/20/2019, RN\ NST:  Baseline: 135 bpm, Variability: Good {> 6 bpm), Accelerations: positive and Decelerations: Variable: severe  Toco q 8 minutes  Lab Results  Component Value Date   WBC 12.6 (H) 09/13/2020   HGB 10.3 (L) 09/13/2020   HCT 33.4 (L) 09/13/2020   MCV 88.1 09/13/2020   PLT 194 09/13/2020         ABO, Rh: --/--/O NEG (12/21 1437)  Antibody: POS (12/21 1437)  Rubella:   Immune RPR: NON REACTIVE (12/21 1437)  HBsAg:   Neg Hep C neg HIV:   Neg GBS:   Pos    Assessment/Plan Active Problems:   Rh negative, antepartum   Pyelonephritis affecting pregnancy in third trimester   Fetal heart rate decelerations affecting management of mother   GBS (group B Streptococcus carrier), +RV culture, currently pregnant   Alpha Thal carrier  For augmentation given decels and > 39 wks Begin Pitocin Consider amnioinfusion if needed. PCN for GBS Rhogam eval  01-19-1997 09/21/2020, 3:38 PM

## 2020-09-22 LAB — RPR: RPR Ser Ql: NONREACTIVE

## 2020-09-22 MED ORDER — AMOXICILLIN-POT CLAVULANATE 875-125 MG PO TABS
1.0000 | ORAL_TABLET | Freq: Two times a day (BID) | ORAL | Status: DC
  Administered 2020-09-22 – 2020-09-23 (×3): 1 via ORAL
  Filled 2020-09-22 (×4): qty 1

## 2020-09-22 MED ORDER — RHO D IMMUNE GLOBULIN 1500 UNIT/2ML IJ SOSY
300.0000 ug | PREFILLED_SYRINGE | Freq: Once | INTRAMUSCULAR | Status: AC
  Administered 2020-09-22: 300 ug via INTRAVENOUS
  Filled 2020-09-22: qty 2

## 2020-09-22 NOTE — Anesthesia Postprocedure Evaluation (Signed)
Anesthesia Post Note  Patient: Christina Willis  Procedure(s) Performed: AN AD HOC LABOR EPIDURAL     Patient location during evaluation: Mother Baby Anesthesia Type: Epidural Level of consciousness: awake and alert Pain management: pain level controlled Vital Signs Assessment: post-procedure vital signs reviewed and stable Respiratory status: spontaneous breathing, nonlabored ventilation and respiratory function stable Cardiovascular status: stable Postop Assessment: no headache, no backache and epidural receding Anesthetic complications: no   No complications documented.  Last Vitals:  Vitals:   09/22/20 0010 09/22/20 0346  BP: 127/80 119/67  Pulse: 88 95  Resp: 18 18  Temp: 36.7 C 36.8 C  SpO2: 100% 100%    Last Pain:  Vitals:   09/22/20 0346  TempSrc: Oral  PainSc:    Pain Goal: Patients Stated Pain Goal: 0 (09/21/20 1430)                 [name]

## 2020-09-22 NOTE — Clinical Social Work Maternal (Signed)
CLINICAL SOCIAL WORK MATERNAL/CHILD NOTE  Patient Details  Name: Christina Willis MRN: [id number] Date of Birth: Feb 02, 1996  Date:  09/22/2020  Clinical Social Worker Initiating Note:  Darra Lis, MSW, [place] Date/Time: Initiated:  09/22/20/1230     Child's Name:  Christina Willis   Biological Parents:  Mother   Need for Interpreter:  None   Reason for Referral:  Late or No Prenatal Care    Address:  [phone] [place] [address]    Phone number:  [phone] (home)     Additional phone number:   Household Members/Support Persons (HM/SP):   Household Member/Support Person 1,Household Member/Support Person 2,Household Member/Support Person 3   HM/SP Name Relationship DOB or Age  HM/SP -1 [name] Significant Other 07/16/1995  HM/SP -2 Lamar Laundry Daughter 08/12/2016  HM/SP -3 Lucia Bitter Son 10/06/2019  HM/SP -4        HM/SP -5        HM/SP -6        HM/SP -7        HM/SP -8          Natural Supports (not living in the home):  Immediate Family   Professional Supports: None   Employment: Unemployed   Type of Work:     Education:  [place] arranged:    Museum/gallery curator Resources:  [employer]   Other Resources:  Physicist, medical ,[place] Considerations Which May Impact Care:    Strengths:  Ability to meet basic needs ,Pediatrician chosen,Home prepared for child    Psychotropic Medications:         Pediatrician:    [employer] area  Pediatrician List:   [facility]      Pediatrician Fax Number:    Risk Factors/Current Problems:  None   Cognitive State:  Alert ,Linear Thinking    Mood/Affect:  Calm ,Flat    CSW Assessment: CSW consulted for late prenatal care at 37 weeks. CSW met with MOB to offer support and complete assessment. Baby was observed in bassinet and FOB was observed on  couch sleeping. CSW asked MOB if she would like to speak at a later time for privacy. MOB declined and stated FOB could remain in the room.  MOB was reserved but receptive to speaking with CSW. CSW informed MOB of reason for consult. MOB expressed understanding and confirmed late prenatal care. MOB reported she does not have a reason for late prenatal care, stating "I just didn't go". MOB has appropriate transportation and had no further explanation. CSW informed MOB of the hospital drug screen policy. MOB was informed a drug screens will be completed on baby and a CPS report will be made if baby test positive. MOB expressed understanding and disclosed CPS history in 2018. MOB reported she can not recall whether CPS was involved while she lived in [place] or [place]. MOB stated a CPS SW came to the home one time and the case was closed. MOB reports she does not recall the reason for CPS involvement and denies any substance use.   MOB stated she currently resides at [address] in [place]. MOB resides with FOB and other children. MOB currently receives [facility] and food stamps. MOB denies any mental health diagnosis and stated she currently is feeling fine. MOB identified her grandmother and FOB  as supports. MOB denies any SI, HI or DV.  CSW provided education regarding the baby blues period vs. perinatal mood disorders, discussed treatment and gave resources for mental health follow up if concerns arise.  CSW recommends self-evaluation during the postpartum time period using the New Mom Checklist from Postpartum Progress and encouraged MOB to contact a medical professional if symptoms are noted at any time.  CSW provided review of Sudden Infant Death Syndrome (SIDS) precautions.  MOB reported baby will sleep in a crib. MOB stated she has everything needed for baby, including a new car seat. MOB identified [facility] for follow-up care and denies any transportation barriers. MOB declined  any additional referrals or resources.   CSW will follow CDS/UDS and make a CPS report if warranted. CSW identifies no further need for intervention and no barriers to discharge at this time.   CSW Plan/Description:  Child Protective Service Report ,Hospital Drug Screen Policy Information,CSW Will Continue to Monitor Umbilical Cord Tissue Drug Screen Results and Make Report if Warranted,Perinatal Mood and Anxiety Disorder (PMADs) Education,No Further Intervention Required/No Barriers to Discharge,Sudden Infant Death Syndrome (SIDS) Education    Waylan Boga, [place] 09/22/2020, 1:03 PM

## 2020-09-22 NOTE — Progress Notes (Signed)
Post Partum Day 1 Subjective: no complaints, up ad lib, voiding and tolerating PO, small lochia, plans to breastfeed, condoms  Objective: Blood pressure 119/67, pulse 95, temperature 98.2 F (36.8 C), temperature source Oral, resp. rate 18, height 5\' 5"  (1.651 m), weight 102.6 kg, last menstrual period 12/18/2019, SpO2 100 %, unknown if currently breastfeeding.  Physical Exam:  General: alert, cooperative and no distress Lochia:normal flow Chest: CTAB Heart: RRR no m/r/g Abdomen: +BS, soft, nontender,  Uterine Fundus: firm DVT Evaluation: No evidence of DVT seen on physical exam. Extremities: no edema  Recent Labs    09/21/20 1600  HGB 12.2  HCT 36.2    Assessment/Plan: Plan for discharge tomorrow   LOS: 1 day   09/23/20 09/22/2020, 7:15 AM

## 2020-09-22 NOTE — Social Work (Signed)
CSW attempted to meet with MOB to complete assessment. MOB requested CSW return after noon.  Manfred Arch, MSW, [employer] Clinical Social Work [employer] and [employer] [phone]

## 2020-09-23 LAB — RH IG WORKUP (INCLUDES ABO/RH)
ABO/RH(D): O NEG
Fetal Screen: NEGATIVE
Gestational Age(Wks): 39
Unit division: 0

## 2020-09-23 MED ORDER — AMOXICILLIN-POT CLAVULANATE 875-125 MG PO TABS: 1.0000 | ORAL_TABLET | Freq: Two times a day (BID) | ORAL | 0 refills | Status: AC

## 2020-09-23 MED ORDER — IBUPROFEN 600 MG PO TABS: 600.0000 mg | ORAL_TABLET | Freq: Four times a day (QID) | ORAL | 0 refills | Status: DC

## 2020-09-23 NOTE — Discharge Instructions (Signed)
Contraception Choices Contraception, also called birth control, refers to methods or devices that prevent pregnancy. Hormonal methods Contraceptive implant  A contraceptive implant is a thin, plastic tube that contains a hormone. It is inserted into the upper part of the arm. It can remain in place for up to 3 years. Progestin-only injections Progestin-only injections are injections of progestin, a synthetic form of the hormone progesterone. They are given every 3 months by a health care provider. Birth control pills  Birth control pills are pills that contain hormones that prevent pregnancy. They must be taken once a day, preferably at the same time each day. Birth control patch  The birth control patch contains hormones that prevent pregnancy. It is placed on the skin and must be changed once a week for three weeks and removed on the fourth week. A prescription is needed to use this method of contraception. Vaginal ring  A vaginal ring contains hormones that prevent pregnancy. It is placed in the vagina for three weeks and removed on the fourth week. After that, the process is repeated with a new ring. A prescription is needed to use this method of contraception. Emergency contraceptive Emergency contraceptives prevent pregnancy after unprotected sex. They come in pill form and can be taken up to 5 days after sex. They work best the sooner they are taken after having sex. Most emergency contraceptives are available without a prescription. This method should not be used as your only form of birth control. Barrier methods Female condom  A female condom is a thin sheath that is worn over the penis during sex. Condoms keep sperm from going inside a woman's body. They can be used with a spermicide to increase their effectiveness. They should be disposed after a single use. Female condom  A female condom is a soft, loose-fitting sheath that is put into the vagina before sex. The condom keeps sperm  from going inside a woman's body. They should be disposed after a single use. Diaphragm  A diaphragm is a soft, dome-shaped barrier. It is inserted into the vagina before sex, along with a spermicide. The diaphragm blocks sperm from entering the uterus, and the spermicide kills sperm. A diaphragm should be left in the vagina for 6-8 hours after sex and removed within 24 hours. A diaphragm is prescribed and fitted by a health care provider. A diaphragm should be replaced every 1-2 years, after giving birth, after gaining more than 15 lb (6.8 kg), and after pelvic surgery. Cervical cap  A cervical cap is a round, soft latex or plastic cup that fits over the cervix. It is inserted into the vagina before sex, along with spermicide. It blocks sperm from entering the uterus. The cap should be left in place for 6-8 hours after sex and removed within 48 hours. A cervical cap must be prescribed and fitted by a health care provider. It should be replaced every 2 years. Sponge  A sponge is a soft, circular piece of polyurethane foam with spermicide on it. The sponge helps block sperm from entering the uterus, and the spermicide kills sperm. To use it, you make it wet and then insert it into the vagina. It should be inserted before sex, left in for at least 6 hours after sex, and removed and thrown away within 30 hours. Spermicides Spermicides are chemicals that kill or block sperm from entering the cervix and uterus. They can come as a cream, jelly, suppository, foam, or tablet. A spermicide should be inserted into the   vagina with an applicator at least 10-15 minutes before sex to allow time for it to work. The process must be repeated every time you have sex. Spermicides do not require a prescription. Intrauterine contraception Intrauterine device (IUD) An IUD is a T-shaped device that is put in a woman's uterus. There are two types:  Hormone IUD.This type contains progestin, a synthetic form of the hormone  progesterone. This type can stay in place for 3-5 years.  Copper IUD.This type is wrapped in copper wire. It can stay in place for 10 years.  Permanent methods of contraception Female tubal ligation In this method, a woman's fallopian tubes are sealed, tied, or blocked during surgery to prevent eggs from traveling to the uterus. Hysteroscopic sterilization In this method, a small, flexible insert is placed into each fallopian tube. The inserts cause scar tissue to form in the fallopian tubes and block them, so sperm cannot reach an egg. The procedure takes about 3 months to be effective. Another form of birth control must be used during those 3 months. Female sterilization This is a procedure to tie off the tubes that carry sperm (vasectomy). After the procedure, the man can still ejaculate fluid (semen). Natural planning methods Natural family planning In this method, a couple does not have sex on days when the woman could become pregnant. Calendar method This means keeping track of the length of each menstrual cycle, identifying the days when pregnancy can happen, and not having sex on those days. Ovulation method In this method, a couple avoids sex during ovulation. Symptothermal method This method involves not having sex during ovulation. The woman typically checks for ovulation by watching changes in her temperature and in the consistency of cervical mucus. Post-ovulation method In this method, a couple waits to have sex until after ovulation. Summary  Contraception, also called birth control, means methods or devices that prevent pregnancy.  Hormonal methods of contraception include implants, injections, pills, patches, vaginal rings, and emergency contraceptives.  Barrier methods of contraception can include female condoms, female condoms, diaphragms, cervical caps, sponges, and spermicides.  There are two types of IUDs (intrauterine devices). An IUD can be put in a woman's uterus to  prevent pregnancy for 3-5 years.  Permanent sterilization can be done through a procedure for males, females, or both.  Natural family planning methods involve not having sex on days when the woman could become pregnant. This information is not intended to replace advice given to you by your health care provider. Make sure you discuss any questions you have with your health care provider. Document Revised: 09/11/2017 Document Reviewed: 10/12/2016 Elsevier Patient Education  2020 [employer] Postpartum Care After Vaginal Delivery This sheet gives you information about how to care for yourself from the time you deliver your baby to up to 6-12 weeks after delivery (postpartum period). Your health care provider may also give you more specific instructions. If you have problems or questions, contact your health care provider. Follow these instructions at home: Vaginal bleeding  It is normal to have vaginal bleeding (lochia) after delivery. Wear a sanitary pad for vaginal bleeding and discharge. ? During the first week after delivery, the amount and appearance of lochia is often similar to a menstrual period. ? Over the next few weeks, it will gradually decrease to a dry, yellow-brown discharge. ? For most women, lochia stops completely by 4-6 weeks after delivery. Vaginal bleeding can vary from woman to woman.  Change your sanitary pads frequently. Watch for any changes   in your flow, such as: ? A sudden increase in volume. ? A change in color. ? Large blood clots.  If you pass a blood clot from your vagina, save it and call your health care provider to discuss. Do not flush blood clots down the toilet before talking with your health care provider.  Do not use tampons or douches until your health care provider says this is safe.  If you are not breastfeeding, your period should return 6-8 weeks after delivery. If you are feeding your child breast milk only (exclusive breastfeeding), your  period may not return until you stop breastfeeding. Perineal care  Keep the area between the vagina and the anus (perineum) clean and dry as told by your health care provider. Use medicated pads and pain-relieving sprays and creams as directed.  If you had a cut in the perineum (episiotomy) or a tear in the vagina, check the area for signs of infection until you are healed. Check for: ? More redness, swelling, or pain. ? Fluid or blood coming from the cut or tear. ? Warmth. ? Pus or a bad smell.  You may be given a squirt bottle to use instead of wiping to clean the perineum area after you go to the bathroom. As you start healing, you may use the squirt bottle before wiping yourself. Make sure to wipe gently.  To relieve pain caused by an episiotomy, a tear in the vagina, or swollen veins in the anus (hemorrhoids), try taking a warm sitz bath 2-3 times a day. A sitz bath is a warm water bath that is taken while you are sitting down. The water should only come up to your hips and should cover your buttocks. Breast care  Within the first few days after delivery, your breasts may feel heavy, full, and uncomfortable (breast engorgement). Milk may also leak from your breasts. Your health care provider can suggest ways to help relieve the discomfort. Breast engorgement should go away within a few days.  If you are breastfeeding: ? Wear a bra that supports your breasts and fits you well. ? Keep your nipples clean and dry. Apply creams and ointments as told by your health care provider. ? You may need to use breast pads to absorb milk that leaks from your breasts. ? You may have uterine contractions every time you breastfeed for up to several weeks after delivery. Uterine contractions help your uterus return to its normal size. ? If you have any problems with breastfeeding, work with your health care provider or Advertising copywriter.  If you are not breastfeeding: ? Avoid touching your breasts a  lot. Doing this can make your breasts produce more milk. ? Wear a good-fitting bra and use cold packs to help with swelling. ? Do not squeeze out (express) milk. This causes you to make more milk. Intimacy and sexuality  Ask your health care provider when you can engage in sexual activity. This may depend on: ? Your risk of infection. ? How fast you are healing. ? Your comfort and desire to engage in sexual activity.  You are able to get pregnant after delivery, even if you have not had your period. If desired, talk with your health care provider about methods of birth control (contraception). Medicines  Take over-the-counter and prescription medicines only as told by your health care provider.  If you were prescribed an antibiotic medicine, take it as told by your health care provider. Do not stop taking the antibiotic even if you  start to feel better. Activity  Gradually return to your normal activities as told by your health care provider. Ask your health care provider what activities are safe for you.  Rest as much as possible. Try to rest or take a nap while your baby is sleeping. Eating and drinking   Drink enough fluid to keep your urine pale yellow.  Eat high-fiber foods every day. These may help prevent or relieve constipation. High-fiber foods include: ? Whole grain cereals and breads. ? Brown rice. ? Beans. ? Fresh fruits and vegetables.  Do not try to lose weight quickly by cutting back on calories.  Take your prenatal vitamins until your postpartum checkup or until your health care provider tells you it is okay to stop. Lifestyle  Do not use any products that contain nicotine or tobacco, such as cigarettes and e-cigarettes. If you need help quitting, ask your health care provider.  Do not drink alcohol, especially if you are breastfeeding. General instructions  Keep all follow-up visits for you and your baby as told by your health care provider. Most women visit  their health care provider for a postpartum checkup within the first 3-6 weeks after delivery. Contact a health care provider if:  You feel unable to cope with the changes that your child brings to your life, and these feelings do not go away.  You feel unusually sad or worried.  Your breasts become red, painful, or hard.  You have a fever.  You have trouble holding urine or keeping urine from leaking.  You have little or no interest in activities you used to enjoy.  You have not breastfed at all and you have not had a menstrual period for 12 weeks after delivery.  You have stopped breastfeeding and you have not had a menstrual period for 12 weeks after you stopped breastfeeding.  You have questions about caring for yourself or your baby.  You pass a blood clot from your vagina. Get help right away if:  You have chest pain.  You have difficulty breathing.  You have sudden, severe leg pain.  You have severe pain or cramping in your lower abdomen.  You bleed from your vagina so much that you fill more than one sanitary pad in one hour. Bleeding should not be heavier than your heaviest period.  You develop a severe headache.  You faint.  You have blurred vision or spots in your vision.  You have bad-smelling vaginal discharge.  You have thoughts about hurting yourself or your baby. If you ever feel like you may hurt yourself or others, or have thoughts about taking your own life, get help right away. You can go to the nearest emergency department or call:  Your local emergency services (911 in the [place]  A suicide crisis helpline, such as the National Suicide Prevention Lifeline at 7142833404. This is open 24 hours a day. Summary  The period of time right after you deliver your newborn up to 6-12 weeks after delivery is called the postpartum period.  Gradually return to your normal activities as told by your health care provider.  Keep all follow-up visits for  you and your baby as told by your health care provider. This information is not intended to replace advice given to you by your health care provider. Make sure you discuss any questions you have with your health care provider. Document Revised: 09/12/2017 Document Reviewed: 06/23/2017 Elsevier Patient Education  2020 [employer].

## 2020-12-21 ENCOUNTER — Ambulatory Visit
Admission: EM | Admit: 2020-12-21 | Discharge: 2020-12-21 | Disposition: A | Discharge status: Home / Self Care | Payer: Medicaid Other | Attending: Family Medicine | Admitting: Family Medicine

## 2020-12-21 ENCOUNTER — Other Ambulatory Visit: Payer: Self-pay

## 2020-12-21 DIAGNOSIS — N898 Other specified noninflammatory disorders of vagina: Secondary | ICD-10-CM

## 2020-12-21 DIAGNOSIS — R030 Elevated blood-pressure reading, without diagnosis of hypertension: Secondary | ICD-10-CM

## 2020-12-21 MED ORDER — METRONIDAZOLE 500 MG PO TABS: 500.0000 mg | ORAL_TABLET | Freq: Two times a day (BID) | ORAL | 0 refills | Status: DC

## 2020-12-21 NOTE — ED Provider Notes (Signed)
[facility]   [id number] 12/21/20 Arrival Time: 0945  ASSESSMENT & PLAN:  1. Vaginal discharge   2. Elevated blood pressure reading without diagnosis of hypertension    Reports h/o BV. Begin: Meds ordered this encounter  Medications  . metroNIDAZOLE (FLAGYL) 500 MG tablet    Sig: Take 1 tablet (500 mg total) by mouth 2 (two) times daily.    Dispense:  14 tablet    Refill:  0      Discharge Instructions      We have sent testing for sexually transmitted infections. We will notify you of any positive results once they are received. If required, we will prescribe any medications you might need.  Please refrain from all sexual activity for at least the next seven days.  Your blood pressure was noted to be elevated during your visit today. If you are currently taking medication for high blood pressure, please ensure you are taking this as directed. If you do not have a history of high blood pressure and your blood pressure remains persistently elevated, you may need to begin taking a medication at some point. You may return here within the next few days to recheck if unable to see your primary care provider or if you do not have a one.  BP (!) 156/76 (BP Location: Left Arm)   Pulse 88   Temp 98 F (36.7 C) (Oral)   Resp 18   LMP 12/14/2020   SpO2 96%   Breastfeeding No   BP Readings from Last 3 Encounters:  12/21/20 (!) 156/76  09/23/20 121/74  09/15/20 132/72       Without s/s of PID.  Labs Reviewed  CERVICOVAGINAL ANCILLARY ONLY   Will notify of any positive results. Instructed to refrain from sexual activity for at least seven days.   Follow-up Information    [facility] .   Specialty: Urgent Care Why: As needed. Contact information: [address] [id number] [address] [address] [phone]              Reviewed expectations re: course of current medical issues. Questions  answered. Outlined signs and symptoms indicating need for more acute intervention. Patient verbalized understanding. After Visit Summary given.   SUBJECTIVE:  Christina Willis is a 25 y.o. female who presents with complaint of vaginal discharge. Onset abrupt. First noticed several days ago. Describes discharge as thin and clear; with odor. H/O BV with same. No specific aggravating or alleviating factors reported. Denies: urinary frequency, dysuria and gross hematuria. Afebrile. No abdominal or pelvic pain. Normal PO intake wihout n/v. No genital rashes or lesions. Reports that she is sexually active. OTC treatment: none.  Patient's last menstrual period was 12/14/2020.  Increased blood pressure noted today. Reports that she has not been treated for hypertension in the past.  She reports no chest pain on exertion, no dyspnea on exertion, no swelling of ankles, no orthostatic dizziness or lightheadedness, no orthopnea or paroxysmal nocturnal dyspnea and no palpitations.   OBJECTIVE:  Vitals:   12/21/20 1002  BP: (!) 156/76  Pulse: 88  Resp: 18  Temp: 98 F (36.7 C)  TempSrc: Oral  SpO2: 96%     General appearance: alert, cooperative, appears stated age and no distress Lungs: unlabored respirations; speaks full sentences without difficulty Back: no CVA tenderness; FROM at waist Abdomen: soft, non-tender GU: deferred Skin: warm and dry Psychological: alert and cooperative; normal mood and affect.   Labs Reviewed  CERVICOVAGINAL ANCILLARY ONLY    Allergies  Allergen Reactions  . Apple Itching  . Banana Itching  . Peanut-Containing Drug Products Swelling    Raw peanuts    Past Medical History:  Diagnosis Date  . Alpha thalassemia silent carrier 06/14/2019   Aa/a- [ ]  FOB testing and Genetic counseling recommended  . Asthma    Family History  Problem Relation Age of Onset  . Cancer Neg Hx   . Diabetes Neg Hx   . Hypertension Neg Hx    Social History    Socioeconomic History  . Marital status: Single    Spouse name: Not on file  . Number of children: Not on file  . Years of education: Not on file  . Highest education level: Not on file  Occupational History  . Not on file  Tobacco Use  . Smoking status: Never Smoker  . Smokeless tobacco: Never Used  Vaping Use  . Vaping Use: Never used  Substance and Sexual Activity  . Alcohol use: No  . Drug use: No  . Sexual activity: Yes    Partners: Male    Birth control/protection: None  Other Topics Concern  . Not on file  Social History Narrative  . Not on file   Social Determinants of Health   Financial Resource Strain: Not on file  Food Insecurity: Not on file  Transportation Needs: Not on file  Physical Activity: Not on file  Stress: Not on file  Social Connections: Not on file  Intimate Partner Violence: Not on file          , MD 12/21/20 1015

## 2020-12-21 NOTE — Discharge Instructions (Signed)
We have sent testing for sexually transmitted infections. We will notify you of any positive results once they are received. If required, we will prescribe any medications you might need.  Please refrain from all sexual activity for at least the next seven days.  Your blood pressure was noted to be elevated during your visit today. If you are currently taking medication for high blood pressure, please ensure you are taking this as directed. If you do not have a history of high blood pressure and your blood pressure remains persistently elevated, you may need to begin taking a medication at some point. You may return here within the next few days to recheck if unable to see your primary care provider or if you do not have a one.  BP (!) 156/76 (BP Location: Left Arm)   Pulse 88   Temp 98 F (36.7 C) (Oral)   Resp 18   LMP 12/14/2020   SpO2 96%   Breastfeeding No   BP Readings from Last 3 Encounters:  12/21/20 (!) 156/76  09/23/20 121/74  09/15/20 132/72

## 2020-12-21 NOTE — ED Triage Notes (Signed)
Pt c/o vaginal itching with slight odor x4 days. States hx of BV after her menstrual cycle which was a week ago. Denies having unprotective intercourse.

## 2020-12-22 LAB — CERVICOVAGINAL ANCILLARY ONLY

## 2020-12-25 NOTE — Progress Notes (Signed)
Patient made aware of need for recollect, states she poured some of the fluid out by accident while collecting.

## 2021-03-29 ENCOUNTER — Encounter (HOSPITAL_COMMUNITY): Payer: Self-pay | Admitting: Emergency Medicine

## 2021-03-29 ENCOUNTER — Emergency Department (HOSPITAL_COMMUNITY)
Admission: EM | Admit: 2021-03-29 | Discharge: 2021-03-29 | Disposition: A | Discharge status: Home / Self Care | Payer: Medicaid Other | Attending: Emergency Medicine | Admitting: Emergency Medicine

## 2021-03-29 DIAGNOSIS — G4700 Insomnia, unspecified: Secondary | ICD-10-CM | POA: Insufficient documentation

## 2021-03-29 DIAGNOSIS — R0602 Shortness of breath: Secondary | ICD-10-CM | POA: Diagnosis not present

## 2021-03-29 DIAGNOSIS — R42 Dizziness and giddiness: Secondary | ICD-10-CM | POA: Diagnosis not present

## 2021-03-29 DIAGNOSIS — Z5321 Procedure and treatment not carried out due to patient leaving prior to being seen by health care provider: Secondary | ICD-10-CM | POA: Insufficient documentation

## 2021-03-29 DIAGNOSIS — R0789 Other chest pain: Secondary | ICD-10-CM | POA: Diagnosis not present

## 2021-03-29 NOTE — ED Triage Notes (Signed)
Pt arrives with ems. Pt c/o tight chest pain, insomnia, shob and dizziness beg earlier today. Denies fevers/n/v/d. No meds pta

## 2021-09-23 NOTE — L&D Delivery Note (Signed)
   Delivery Note:   [id number] at [redacted]w[redacted]d  Admitting diagnosis: Encounter for elective induction of labor [Z34.90] Pregnant [Z34.90] Risks: Insufficient prenatal care.   Insufficient prenatal care Lewis isoimmunization  Hx alcohol/Percocet use  Alpha thalassemia silent carrier Maternal obesity (BMI 35) Rh negative  First Stage:  Induction of labor:eIOL  Onset of labor: 03/11/22 @ 1506 Augmentation: AROM and Pitocin ROM: AROM mec stained fluid @ 1500 Active labor onset: @ 1701  Analgesia /Anesthesia/Pain control intrapartum:  Epidural   Second Stage:  Complete dilation at   1758 Onset of pushing at 1758 FHR second stage Cat II. 140bpm with min/moderate variability. Variable decels with contractions.   CNM called to bedside. Upon arrival fetal head out, and mother involuntarily bearing down. Fetal body forcefully expelled with maternal contraction. Patient Pushed in lithotomy position with CNM and L&D staff support, present for birth and supportive.   Delivery of a Live born female  Birth Weight:  PENDING  APGAR: 8,9   Newborn Delivery   Birth date/time:  Delivery type:      Fetal head delivered in cephalic presentation.  Nuchal Cord: No   Infant with poor tone and color. Cord double clamped and cut by M. Early RN and infant moved to fetal warmer. By 2 minute of life infant tone improved and infant vigorously crying.   Collection of cord blood for typing completed. Cord blood donation- n/a  Arterial cord blood sample- n/a    Third Stage:  Placenta delivered spontaneously and intact with gentle cord tractions and LUS massage. Uterine tone firm bleeding minimal Uterotonics: IV pitocin bolus initiated  Placenta to L&D for disposal.    No laceration identified.  Episiotomy: N/a  Local analgesia: n/a   Repair:n/a  Est. Blood Loss (mL):175cc Complications: None   Mom to postpartum.  Baby boy to Couplet care / Skin to Skin.  Delivery Report:  Review the Delivery  Report for details.    Christina Willis Danella Deis) [name], MSN, CNM  [facility]  03/11/22 6:09 PM

## 2021-11-22 ENCOUNTER — Encounter (HOSPITAL_COMMUNITY): Payer: Self-pay

## 2021-11-22 ENCOUNTER — Other Ambulatory Visit: Payer: Self-pay

## 2021-11-22 ENCOUNTER — Inpatient Hospital Stay (HOSPITAL_COMMUNITY)
Admission: AD | Admit: 2021-11-22 | Discharge: 2021-11-22 | Disposition: A | Discharge status: Home / Self Care | Payer: Medicaid Other | Attending: Obstetrics & Gynecology | Admitting: Obstetrics & Gynecology

## 2021-11-22 DIAGNOSIS — D563 Thalassemia minor: Secondary | ICD-10-CM | POA: Diagnosis not present

## 2021-11-22 DIAGNOSIS — O26892 Other specified pregnancy related conditions, second trimester: Secondary | ICD-10-CM | POA: Insufficient documentation

## 2021-11-22 DIAGNOSIS — K2100 Gastro-esophageal reflux disease with esophagitis, without bleeding: Secondary | ICD-10-CM | POA: Diagnosis not present

## 2021-11-22 DIAGNOSIS — O99320 Drug use complicating pregnancy, unspecified trimester: Secondary | ICD-10-CM | POA: Insufficient documentation

## 2021-11-22 DIAGNOSIS — O99612 Diseases of the digestive system complicating pregnancy, second trimester: Secondary | ICD-10-CM | POA: Diagnosis not present

## 2021-11-22 DIAGNOSIS — O99342 Other mental disorders complicating pregnancy, second trimester: Secondary | ICD-10-CM

## 2021-11-22 DIAGNOSIS — O26893 Other specified pregnancy related conditions, third trimester: Secondary | ICD-10-CM | POA: Diagnosis not present

## 2021-11-22 DIAGNOSIS — R079 Chest pain, unspecified: Secondary | ICD-10-CM | POA: Insufficient documentation

## 2021-11-22 DIAGNOSIS — Z3A25 25 weeks gestation of pregnancy: Secondary | ICD-10-CM | POA: Diagnosis not present

## 2021-11-22 DIAGNOSIS — O99322 Drug use complicating pregnancy, second trimester: Secondary | ICD-10-CM | POA: Insufficient documentation

## 2021-11-22 DIAGNOSIS — Z6791 Unspecified blood type, Rh negative: Secondary | ICD-10-CM | POA: Diagnosis not present

## 2021-11-22 DIAGNOSIS — O26899 Other specified pregnancy related conditions, unspecified trimester: Secondary | ICD-10-CM

## 2021-11-22 DIAGNOSIS — R102 Pelvic and perineal pain: Secondary | ICD-10-CM

## 2021-11-22 DIAGNOSIS — F419 Anxiety disorder, unspecified: Secondary | ICD-10-CM | POA: Insufficient documentation

## 2021-11-22 LAB — HIV ANTIBODY (ROUTINE TESTING W REFLEX): HIV Screen 4th Generation wRfx: NONREACTIVE

## 2021-11-22 LAB — COMPREHENSIVE METABOLIC PANEL
ALT: 20 U/L (ref 0–44)
AST: 22 U/L (ref 15–41)
Albumin: 3.1 g/dL — ABNORMAL LOW (ref 3.5–5.0)
Alkaline Phosphatase: 58 U/L (ref 38–126)
Anion gap: 8 (ref 5–15)
BUN: 5 mg/dL — ABNORMAL LOW (ref 6–20)
CO2: 24 mmol/L (ref 22–32)
Calcium: 8.6 mg/dL — ABNORMAL LOW (ref 8.9–10.3)
Chloride: 104 mmol/L (ref 98–111)
Creatinine, Ser: 0.56 mg/dL (ref 0.44–1.00)
GFR, Estimated: >60 mL/min (ref >60)
Glucose, Bld: 86 mg/dL (ref 70–99)
Potassium: 3.4 mmol/L — ABNORMAL LOW (ref 3.5–5.1)
Sodium: 136 mmol/L (ref 135–145)
Total Bilirubin: 0.2 mg/dL — ABNORMAL LOW (ref 0.3–1.2)
Total Protein: 6.5 g/dL (ref 6.5–8.1)

## 2021-11-22 LAB — HEMOGLOBIN A1C
Hgb A1c MFr Bld: 4.8 % (ref 4.8–5.6)
Mean Plasma Glucose: 91.06 mg/dL

## 2021-11-22 LAB — DIFFERENTIAL
Abs Immature Granulocytes: 0.02 10*3/uL (ref 0.00–0.07)
Basophils Absolute: 0 10*3/uL (ref 0.0–0.1)
Basophils Relative: 0 %
Eosinophils Absolute: 0.1 10*3/uL (ref 0.0–0.5)
Eosinophils Relative: 1 %
Immature Granulocytes: 0 %
Lymphocytes Relative: 22 %
Lymphs Abs: 1.8 10*3/uL (ref 0.7–4.0)
Monocytes Absolute: 0.3 10*3/uL (ref 0.1–1.0)
Monocytes Relative: 4 %
Neutro Abs: 6.2 10*3/uL (ref 1.7–7.7)
Neutrophils Relative %: 73 %

## 2021-11-22 LAB — CBC
HCT: 35.2 % — ABNORMAL LOW (ref 36.0–46.0)
Hemoglobin: 11.4 g/dL — ABNORMAL LOW (ref 12.0–15.0)
MCH: 28 pg (ref 26.0–34.0)
MCHC: 32.4 g/dL (ref 30.0–36.0)
MCV: 86.5 fL (ref 80.0–100.0)
Platelets: 294 10*3/uL (ref 150–400)
RBC: 4.07 MIL/uL (ref 3.87–5.11)
RDW: 14.6 % (ref 11.5–15.5)
WBC: 8.5 10*3/uL (ref 4.0–10.5)
nRBC: 0 % (ref 0.0–0.2)

## 2021-11-22 LAB — RPR: RPR Ser Ql: NONREACTIVE

## 2021-11-22 LAB — HEPATITIS B SURFACE ANTIGEN: Hepatitis B Surface Ag: NONREACTIVE

## 2021-11-22 LAB — TROPONIN I (HIGH SENSITIVITY): Troponin I (High Sensitivity): 3 ng/L (ref <18)

## 2021-11-22 MED ORDER — HYDROXYZINE PAMOATE 25 MG PO CAPS: 25.0000 mg | ORAL_CAPSULE | Freq: Three times a day (TID) | ORAL | 3 refills | Status: DC | PRN

## 2021-11-22 MED ORDER — LIDOCAINE VISCOUS HCL 2 % MT SOLN
15.0000 mL | Freq: Once | OROMUCOSAL | Status: AC
Start: 2021-11-22 — End: 2021-11-22
  Administered 2021-11-22: 15 mL via ORAL
  Filled 2021-11-22: qty 15

## 2021-11-22 MED ORDER — ALUM & MAG HYDROXIDE-SIMETH 200-200-20 MG/5ML PO SUSP
30.0000 mL | Freq: Once | ORAL | Status: AC
  Administered 2021-11-22: 30 mL via ORAL
  Filled 2021-11-22: qty 30

## 2021-11-22 MED ORDER — CYCLOBENZAPRINE HCL 10 MG PO TABS
10.0000 mg | ORAL_TABLET | Freq: Three times a day (TID) | ORAL | 0 refills | Status: DC | PRN
Start: 2021-11-22 — End: 2022-03-02

## 2021-11-22 MED ORDER — PANTOPRAZOLE SODIUM 20 MG PO TBEC: 20.0000 mg | DELAYED_RELEASE_TABLET | Freq: Every day | ORAL | 3 refills | Status: DC

## 2021-11-22 NOTE — MAU Provider Note (Signed)
Chief Complaint:  Chest Pain   Event Date/Time   First Provider Initiated Contact with Patient 11/22/21 1413      HPI: Christina Willis is a 26 y.o. [id number] at [redacted]w[redacted]d by LMP who presents to maternity admissions reporting chest pain that radiates up into her neck. She reports daily heartburn but it has never felt like this.  She also reports anxiety and on screening reports she is taking Percocet daily that is not prescribed to her and has had alcohol in the last month.  She believes she is self-medicating for anxiety/panic attacks.  She also reports the Percocet helps with round ligament pain.  She denies any abdominal pain today.  Her chest pain mostly resolved prior to treatment in MAU.  She has not yet started prenatal care and reports that she was frustrated with seeing so many different providers with her last pregnancy at [facility] She reports good fetal movement, denies LOF, vaginal bleeding, vaginal itching/burning, urinary symptoms, h/a, dizziness, n/v, or fever/chills.      HPI  Past Medical History: Past Medical History:  Diagnosis Date   Alpha thalassemia silent carrier 06/14/2019   Aa/a- [ ]  FOB testing and Genetic counseling recommended   Asthma     Past obstetric history: OB History  Gravida Para Term Preterm AB Living  4 3 3     3   SAB IAB Ectopic Multiple Live Births        0 3    # Outcome Date GA Lbr Len/2nd Weight Sex Delivery Anes PTL Lv  4 Current           3 Term 09/21/20 [redacted]w[redacted]d 12:00 / 00:06 3405 g F Vag-Spont EPI  LIV  2 Term 09/2019    M Vag-Spont   LIV  1 Term 2017    F Vag-Spont   LIV    Past Surgical History: Past Surgical History:  Procedure Laterality Date   NO PAST SURGERIES      Family History: Family History  Problem Relation Age of Onset   Cancer Neg Hx    Diabetes Neg Hx    Hypertension Neg Hx     Social History: Social History   Tobacco Use   Smoking status: Never   Smokeless tobacco: Never  Vaping Use   Vaping Use: Never used   Substance Use Topics   Alcohol use: No   Drug use: No    Allergies:  Allergies  Allergen Reactions   Apple Juice Itching   Banana Itching   Peanut-Containing Drug Products Swelling    Raw peanuts    Meds:  No medications prior to admission.    ROS:  Review of Systems  Constitutional:  Negative for chills, fatigue and fever.  Eyes:  Negative for visual disturbance.  Respiratory:  Negative for shortness of breath.   Cardiovascular:  Positive for chest pain.  Gastrointestinal:  Negative for abdominal pain, nausea and vomiting.  Genitourinary:  Negative for difficulty urinating, dysuria, flank pain, pelvic pain, vaginal bleeding, vaginal discharge and vaginal pain.  Neurological:  Negative for dizziness and headaches.  Psychiatric/Behavioral: Negative.      I have reviewed patient's Past Medical Hx, Surgical Hx, Family Hx, Social Hx, medications and allergies.   Physical Exam  Patient Vitals for the past 24 hrs:  BP Temp Temp src Pulse Resp SpO2 Height Weight  11/22/21 1403 138/82 -- -- 87 -- 100 % -- --  11/22/21 1237 125/76 -- -- 97 -- -- -- --  11/22/21 1123 -- -- -- -- -- --  5\' 5"  (1.651 m) 87.7 kg  11/22/21 1122 130/68 98.3 F (36.8 C) Oral 94 17 100 % -- --   Constitutional: Well-developed, well-nourished female in no acute distress.  HEART: normal rate, heart sounds, regular rhythm RESP: normal effort, lung sounds clear and equal bilaterally  GI: Abd soft, non-tender, gravid appropriate for gestational age.  MS: Extremities nontender, no edema, normal ROM Neurologic: Alert and oriented x 4.  GU: Neg CVAT.  PELVIC EXAM: Deferred     FHT:  Baseline 145 , moderate variability, accelerations present, no decelerations Contractions: none on toco or to palpation   EKG: normal EKG, normal sinus rhythm.  Labs: Results for orders placed or performed during the hospital encounter of 11/22/21 (from the past 24 hour(s))  Hepatitis B surface antigen     Status: None    Collection Time: 11/22/21 12:00 PM  Result Value Ref Range   Hepatitis B Surface Ag NON REACTIVE NON REACTIVE  CBC     Status: Abnormal   Collection Time: 11/22/21 12:00 PM  Result Value Ref Range   WBC 8.5 4.0 - 10.5 K/uL   RBC 4.07 3.87 - 5.11 MIL/uL   Hemoglobin 11.4 (L) 12.0 - 15.0 g/dL   HCT 16.135.2 (L) 09.636.0 - 04.546.0 %   MCV 86.5 80.0 - 100.0 fL   MCH 28.0 26.0 - 34.0 pg   MCHC 32.4 30.0 - 36.0 g/dL   RDW 40.914.6 81.111.5 - 91.415.5 %   Platelets 294 150 - 400 K/uL   nRBC 0.0 0.0 - 0.2 %  Differential     Status: None   Collection Time: 11/22/21 12:00 PM  Result Value Ref Range   Neutrophils Relative % 73 %   Neutro Abs 6.2 1.7 - 7.7 K/uL   Lymphocytes Relative 22 %   Lymphs Abs 1.8 0.7 - 4.0 K/uL   Monocytes Relative 4 %   Monocytes Absolute 0.3 0.1 - 1.0 K/uL   Eosinophils Relative 1 %   Eosinophils Absolute 0.1 0.0 - 0.5 K/uL   Basophils Relative 0 %   Basophils Absolute 0.0 0.0 - 0.1 K/uL   Immature Granulocytes 0 %   Abs Immature Granulocytes 0.02 0.00 - 0.07 K/uL  HIV Antibody (routine testing w rflx)     Status: None   Collection Time: 11/22/21 12:00 PM  Result Value Ref Range   HIV Screen 4th Generation wRfx Non Reactive Non Reactive  Type and screen [facility]     Status: None   Collection Time: 11/22/21 12:00 PM  Result Value Ref Range   ABO/RH(D) O NEG    Antibody Screen POS    Sample Expiration      11/25/2021,2359 Performed at [facility], [address] [place], [place] [address]   Hemoglobin A1c     Status: None   Collection Time: 11/22/21 12:00 PM  Result Value Ref Range   Hgb A1c MFr Bld 4.8 4.8 - 5.6 %   Mean Plasma Glucose 91.06 mg/dL  Troponin I (High Sensitivity)     Status: None   Collection Time: 11/22/21 12:00 PM  Result Value Ref Range   Troponin I (High Sensitivity) 3 <18 ng/L  Comprehensive metabolic panel     Status: Abnormal   Collection Time: 11/22/21 12:00 PM  Result Value Ref Range   Sodium 136 135 - 145 mmol/L    Potassium 3.4 (L) 3.5 - 5.1 mmol/L   Chloride 104 98 - 111 mmol/L   CO2 24 22 -  32 mmol/L   Glucose, Bld 86 70 - 99 mg/dL   BUN 5 (L) 6 - 20 mg/dL   Creatinine, Ser 2.97 0.44 - 1.00 mg/dL   Calcium 8.6 (L) 8.9 - 10.3 mg/dL   Total Protein 6.5 6.5 - 8.1 g/dL   Albumin 3.1 (L) 3.5 - 5.0 g/dL   AST 22 15 - 41 U/L   ALT 20 0 - 44 U/L   Alkaline Phosphatase 58 38 - 126 U/L   Total Bilirubin 0.2 (L) 0.3 - 1.2 mg/dL   GFR, Estimated >98 >92 mL/min   Anion gap 8 5 - 15   --/--/O NEG (03/02 1200)  Imaging:  No results found.  MAU Course/MDM: Orders Placed This Encounter  Procedures   [place] MFM OB DETAIL +14 WK   Hepatitis B surface antigen   Rubella screen   RPR   CBC   Differential   HIV Antibody (routine testing w rflx)   Hemoglobin A1c   Comprehensive metabolic panel   ToxASSURE Select 13 (MW), Urine [facility])   ED EKG   Type and screen [facility]   Discharge patient    Meds ordered this encounter  Medications   AND Linked Order Group    alum & mag hydroxide-simeth (MAALOX/MYLANTA) 200-200-20 MG/5ML suspension 30 mL    lidocaine (XYLOCAINE) 2 % viscous mouth solution 15 mL   hydrOXYzine (VISTARIL) 25 MG capsule    Sig: Take 1 capsule (25 mg total) by mouth 3 (three) times daily as needed.    Dispense:  30 capsule    Refill:  3    Order Specific Question:   Supervising Provider    Answer:   Myna Hidalgo [[id number]]   cyclobenzaprine (FLEXERIL) 10 MG tablet    Sig: Take 1 tablet (10 mg total) by mouth 3 (three) times daily as needed for muscle spasms.    Dispense:  20 tablet    Refill:  0    Order Specific Question:   Supervising Provider    Answer:   Myna Hidalgo [[id number]]   pantoprazole (PROTONIX) 20 MG tablet    Sig: Take 1 tablet (20 mg total) by mouth daily.    Dispense:  30 tablet    Refill:  3    Order Specific Question:   Supervising Provider    Answer:   Myna Hidalgo [[id number]]     NST reviewed and appropriate for gestational age Chest  pain 90% resolved without treatment, GI cocktail given and pt reports pain 100% resolved Screening for substance use finds pt use of Percocet 10 mg BID to TID for anxiety and round ligament pain.  These are not prescribed to her per pt.  Consult Dr Crissie Reese with assessment and findings. She denies any withdrawal symptoms if she does not take Percocet for 2-3 days.   Pt agreed to follow up with integrated behavioral health and message sent to establish care at [facility]. Pt consents to Toxisure UDS today.  Heart and lung sounds wnl, EKG wnl, labs without emergent findings.  No Pain likely acid reflux, may be esophageal spasms, and/or complicated by anxiety Discussed pt use of Percocet and offered Vistaril PRN for anxiety, as well as discussed treatments for round ligament pain including heat/ice/warm bath/Tylenol and pregnnacy support belt. Rx for Flexeril for PRN use.  Rx for daily Protonix for heartburn.  Warning signs/reasons to return to MAU reviewed Message sent to [facility] to establish care and outpatient MFM Detail [place] ordered     Assessment:  1. Rh negative, antepartum   2. 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 [redacted] weeks gestation of pregnancy   4. Gastroesophageal reflux disease with esophagitis without hemorrhage   5. Anxiety during pregnancy, antepartum, second trimester   6. Pain of round ligament affecting pregnancy, antepartum     Plan: Discharge home Labor precautions and fetal kick counts  Allergies as of 11/22/2021       Reactions   Apple Juice Itching   Banana Itching   Peanut-containing Drug Products Swelling   Raw peanuts        Medication List     STOP taking these medications    ibuprofen 600 MG tablet Commonly known as: ADVIL   metroNIDAZOLE 500 MG tablet Commonly known as: FLAGYL       TAKE these medications    albuterol 108 (90 Base) MCG/ACT inhaler Commonly known as: VENTOLIN HFA Inhale 2 puffs into the lungs every 4 (four) hours  as needed for wheezing or shortness of breath (asthma). Reported on 04/04/2016   cyclobenzaprine 10 MG tablet Commonly known as: FLEXERIL Take 1 tablet (10 mg total) by mouth 3 (three) times daily as needed for muscle spasms.   hydrOXYzine 25 MG capsule Commonly known as: Vistaril Take 1 capsule (25 mg total) by mouth 3 (three) times daily as needed.   pantoprazole 20 MG tablet Commonly known as: PROTONIX Take 1 tablet (20 mg total) by mouth daily.        Sharen Counter Certified Nurse-Midwife 11/22/2021 2:30 PM

## 2021-11-22 NOTE — MAU Note (Addendum)
...  Christina Willis is a 26 y.o. at [redacted]w[redacted]d here in MAU reporting: Chest pain that "shot to the left side of my neck" around an hour ago. Currently feeling the chest pain. She states she always has chest pain every night and states her OB is not aware. Endorsing occasional lower back pain that occurs when she bends over. No VB or LOF. +FM.  ? ?Pain score:  ?7/10 chest pain ?7/10 neck pain ? ?FHT: 145 initial external ? ? ?

## 2021-11-23 ENCOUNTER — Other Ambulatory Visit: Payer: Self-pay | Admitting: Family Medicine

## 2021-11-23 DIAGNOSIS — O99323 Drug use complicating pregnancy, third trimester: Secondary | ICD-10-CM

## 2021-11-23 MED ORDER — NALOXONE HCL 4 MG/0.1ML NA LIQD: NASAL | 1 refills | Status: DC

## 2021-11-23 NOTE — Progress Notes (Signed)
Narcan outpatient rx ?

## 2021-11-24 LAB — RUBELLA SCREEN: Rubella: 4.74 index (ref >0.99)

## 2021-11-25 LAB — TYPE AND SCREEN
ABO/RH(D): O NEG
Antibody Screen: POSITIVE
Unit division: 0
Unit division: 0

## 2021-11-25 LAB — BPAM RBC
Blood Product Expiration Date: 202304042359
Blood Product Expiration Date: 202304062359
Unit Type and Rh: 9500
Unit Type and Rh: 9500

## 2021-12-17 DIAGNOSIS — O0930 Supervision of pregnancy with insufficient antenatal care, unspecified trimester: Secondary | ICD-10-CM | POA: Insufficient documentation

## 2021-12-18 ENCOUNTER — Institutional Professional Consult (permissible substitution): Payer: Medicaid Other | Admitting: Licensed Clinical Social Worker

## 2021-12-18 ENCOUNTER — Encounter: Payer: Medicaid Other | Admitting: Obstetrics & Gynecology

## 2021-12-18 ENCOUNTER — Other Ambulatory Visit: Payer: Medicaid Other

## 2021-12-18 ENCOUNTER — Telehealth: Payer: Self-pay | Admitting: Advanced Practice Midwife

## 2021-12-18 DIAGNOSIS — O0930 Supervision of pregnancy with insufficient antenatal care, unspecified trimester: Secondary | ICD-10-CM

## 2021-12-18 DIAGNOSIS — F419 Anxiety disorder, unspecified: Secondary | ICD-10-CM

## 2021-12-18 DIAGNOSIS — O26893 Other specified pregnancy related conditions, third trimester: Secondary | ICD-10-CM

## 2021-12-18 DIAGNOSIS — O99891 Dorsalgia, unspecified: Secondary | ICD-10-CM

## 2021-12-18 MED ORDER — BUSPIRONE HCL 5 MG PO TABS: 5.0000 mg | ORAL_TABLET | Freq: Three times a day (TID) | ORAL | 3 refills | Status: DC | PRN

## 2021-12-18 NOTE — Telephone Encounter (Signed)
[id number]  at [redacted]w[redacted]d  has new OB appt today but is unable to make her appt.  I called patient and she has car trouble and needs to reschedule.  She reports she had an opposite reaction to Vistaril prescribed in MAU at 25 weeks and it makes her feel jittery and more anxious.  She also still reports back and pelvic pain not relieved by support belt and Flexeril.  Rx for physical therapy placed and Rx for Vistaril d/c'd and Buspar added for anxiety.  Pt transferred to front desk to reschedule with first available and I will see pt for her next visit.   ?

## 2021-12-20 ENCOUNTER — Ambulatory Visit: Payer: Medicaid Other | Attending: Advanced Practice Midwife | Admitting: Physical Therapy

## 2021-12-24 ENCOUNTER — Encounter: Payer: Self-pay | Admitting: Obstetrics and Gynecology

## 2021-12-24 ENCOUNTER — Ambulatory Visit (INDEPENDENT_AMBULATORY_CARE_PROVIDER_SITE_OTHER): Payer: Medicaid Other | Admitting: Licensed Clinical Social Worker

## 2021-12-24 ENCOUNTER — Other Ambulatory Visit (HOSPITAL_COMMUNITY)
Admission: RE | Admit: 2021-12-24 | Discharge: 2021-12-24 | Disposition: A | Discharge status: Home / Self Care | Payer: Medicaid Other | Source: Ambulatory Visit | Attending: Obstetrics & Gynecology | Admitting: Obstetrics & Gynecology

## 2021-12-24 ENCOUNTER — Ambulatory Visit (INDEPENDENT_AMBULATORY_CARE_PROVIDER_SITE_OTHER): Payer: Medicaid Other | Admitting: Obstetrics and Gynecology

## 2021-12-24 VITALS — BP 117/74 | HR 93 | Wt 198.0 lb

## 2021-12-24 DIAGNOSIS — O361990 Maternal care for other isoimmunization, unspecified trimester, not applicable or unspecified: Secondary | ICD-10-CM | POA: Insufficient documentation

## 2021-12-24 DIAGNOSIS — O26899 Other specified pregnancy related conditions, unspecified trimester: Secondary | ICD-10-CM

## 2021-12-24 DIAGNOSIS — Z658 Other specified problems related to psychosocial circumstances: Secondary | ICD-10-CM | POA: Diagnosis not present

## 2021-12-24 DIAGNOSIS — O361930 Maternal care for other isoimmunization, third trimester, not applicable or unspecified: Secondary | ICD-10-CM | POA: Diagnosis not present

## 2021-12-24 DIAGNOSIS — O0930 Supervision of pregnancy with insufficient antenatal care, unspecified trimester: Secondary | ICD-10-CM | POA: Insufficient documentation

## 2021-12-24 DIAGNOSIS — D563 Thalassemia minor: Secondary | ICD-10-CM

## 2021-12-24 DIAGNOSIS — Z6791 Unspecified blood type, Rh negative: Secondary | ICD-10-CM

## 2021-12-24 LAB — HEPATITIS C ANTIBODY: HCV Ab: NEGATIVE

## 2021-12-24 MED ORDER — RHO D IMMUNE GLOBULIN 1500 UNIT/2ML IJ SOSY
300.0000 ug | PREFILLED_SYRINGE | Freq: Once | INTRAMUSCULAR | Status: AC
  Administered 2021-12-24: 300 ug via INTRAMUSCULAR

## 2021-12-24 MED ORDER — PRENATAL 27-0.8 MG PO TABS: 1.0000 | ORAL_TABLET | Freq: Every day | ORAL | 0 refills | Status: DC

## 2021-12-24 NOTE — Progress Notes (Signed)
Subjective:  ?Christina Willis is a 26 y.o. [phone] at [redacted]w[redacted]d being seen today for her first OB visit. H/O TSVD x 3 without problems. H/O substance abuse.  She is currently monitored for the following issues for this high-risk pregnancy and has Rh negative, antepartum; Insufficient prenatal care; Maternal obesity syndrome, antepartum; Alpha thalassemia silent carrier; Drug use complicating pregnancy; Supervision of pregnancy with insufficient antenatal care; and Lewis isoimmunization during pregnancy on their problem list. ? ?Patient reports no complaints.  Contractions: Not present. Vag. Bleeding: None.  Movement: Present. Denies leaking of fluid.  ? ?The following portions of the patient's history were reviewed and updated as appropriate: allergies, current medications, past family history, past medical history, past social history, past surgical history and problem list. Problem list updated. ? ?Objective:  ? ?Vitals:  ? 12/24/21 1321  ?BP: 117/74  ?Pulse: 93  ?Weight: 198 lb (89.8 kg)  ? ? ?Fetal Status: Fetal Heart Rate (bpm): 140   Movement: Present    ? ?General:  Alert, oriented and cooperative. Patient is in no acute distress.  ?Skin: Skin is warm and dry. No rash noted.   ?Cardiovascular: Normal heart rate noted  ?Respiratory: Normal respiratory effort, no problems with respiration noted  ?Abdomen: Soft, gravid, appropriate for gestational age. Pain/Pressure: Present     ?Pelvic:  Cervical exam deferred        ?Extremities: Normal range of motion.     ?Mental Status: Normal mood and affect. Normal behavior. Normal judgment and thought content.  ? ?Urinalysis:     ? ?Assessment and Plan:  ?Pregnancy: [id number] at [redacted]w[redacted]d ? ?1. Supervision of pregnancy with insufficient antenatal care ?Prenatal care and labs reviewed with pt ?- Prenatal Vit-Fe Fumarate-FA (MULTIVITAMIN-PRENATAL) 27-0.8 MG TABS tablet; Take 1 tablet by mouth daily at 12 noon.  Dispense: 30 tablet; Refill: 0 ?- [place] MFM OB DETAIL +14 WK; Future ?-  Ambulatory referral to [place] ?- Hepatitis C Antibody ?- Urine Culture ?- Cervicovaginal ancillary only( [place]) ? ?2. Rh negative, antepartum ?Rhogam today ? ?3. Alpha thalassemia silent carrier ?Stable ? ?4. 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 ?Preterm labor symptoms and general obstetric precautions including but not limited to vaginal bleeding, contractions, leaking of fluid and fetal movement were reviewed in detail with the patient. ?Please refer to After Visit Summary for other counseling recommendations.  ?Return in about 2 weeks (around 01/07/2022) for OB visit, face to face, any provider. ? ? ?Chancy Milroy, MD ?

## 2021-12-24 NOTE — Patient Instructions (Signed)

## 2021-12-24 NOTE — Progress Notes (Signed)
PHQ 9 score 6. ?GAD 7 score 9.  ? ? ? ?

## 2021-12-24 NOTE — Addendum Note (Signed)
Addended by: Lewie Loron D on: 12/24/2021 02:18 PM ? ? Modules accepted: Orders ? ?

## 2021-12-25 LAB — CERVICOVAGINAL ANCILLARY ONLY
Chlamydia: NEGATIVE
Comment: NEGATIVE
Comment: NORMAL
Neisseria Gonorrhea: NEGATIVE

## 2021-12-25 LAB — HEPATITIS C ANTIBODY: Hep C Virus Ab: NONREACTIVE

## 2021-12-25 NOTE — BH Specialist Note (Signed)
Integrated Behavioral Health Initial In-Person Visit ? ?MRN: [id number] ?Name: Christina Willis ? ?Number of Integrated Behavioral Health Clinician visits: 1 ?Session Start time:   1:58pm ?Session End time: 2:30pm ?Total time in minutes: 32 mins in person at [facility]  ? ?Types of Service: Individual psychotherapy ? ?Interpretor:No. Interpretor Name and Language: none ? ? Warm Hand Off Completed. ?  ? ?  ? ? ?Subjective: ?[name] is a 26 y.o. female accompanied by n/a ?Patient was referred by Mercy Riding CMA  for psychosocial issues. ?Patient reports the following symptoms/concerns: substance use  ?Duration of problem: One month; Severity of problem: mild ? ?Objective: ?Mood: depressed and Affect: Appropriate ?Risk of harm to self or others: No plan to harm self or others ? ?Life Context: ?Family and Social: lives with father of baby ?School/Work: n/a ?Self-Care: n/a ?Life Changes: n/a ? ?Patient and/or Family's Strengths/Protective Factors: ?Sense of purpose ? ?Goals Addressed: ?Patient will: ?Reduce symptoms of: stress ?Increase knowledge and/or ability of: coping skills  ?Demonstrate ability to: Increase healthy adjustment to current life circumstances ? ?Progress towards Goals: ?Ongoing ? ?Interventions: ?Interventions utilized: Motivational Interviewing and Link to [employer]  ?Standardized Assessments completed: PHQ 9 ? ?Patient and/or Family Response: [name] responded well to visit.  ? ? ? ?Assessment: ?Patient currently experiencing psychosocial stressor. [name] reports considering adoption for this current pregnancy. According to [name], she was taking percocet for pain.  ?  ?Patient may benefit from integrated behavioral health. ? ?Plan: ?Follow up with/ behavioral health clinician on : 01/08/2022 ?Behavioral recommendations:  ?Referral(s): Integrated [facility] [facility] (In Clinic) ?"From scale of 1-10, how likely are you to follow plan?":   ? ?[name],  LCSW ? ? ? ? ? ? ? ? ?

## 2021-12-26 ENCOUNTER — Other Ambulatory Visit: Payer: Medicaid Other

## 2021-12-26 ENCOUNTER — Encounter: Payer: Self-pay | Admitting: Obstetrics and Gynecology

## 2021-12-26 LAB — URINE CULTURE

## 2021-12-27 ENCOUNTER — Other Ambulatory Visit: Payer: Self-pay | Admitting: *Deleted

## 2021-12-27 ENCOUNTER — Ambulatory Visit: Payer: Medicaid Other | Admitting: *Deleted

## 2021-12-27 ENCOUNTER — Encounter: Payer: Self-pay | Admitting: *Deleted

## 2021-12-27 ENCOUNTER — Ambulatory Visit: Payer: Medicaid Other | Attending: Obstetrics and Gynecology

## 2021-12-27 VITALS — BP 118/70 | HR 91

## 2021-12-27 DIAGNOSIS — O0930 Supervision of pregnancy with insufficient antenatal care, unspecified trimester: Secondary | ICD-10-CM | POA: Diagnosis present

## 2021-12-27 DIAGNOSIS — O0933 Supervision of pregnancy with insufficient antenatal care, third trimester: Secondary | ICD-10-CM

## 2021-12-27 DIAGNOSIS — Z3493 Encounter for supervision of normal pregnancy, unspecified, third trimester: Secondary | ICD-10-CM

## 2022-01-04 ENCOUNTER — Other Ambulatory Visit: Payer: Medicaid Other

## 2022-01-10 ENCOUNTER — Telehealth: Payer: Self-pay

## 2022-01-10 NOTE — Telephone Encounter (Signed)
The patient was called in order to reschedule her missed appointment.  LVM asking patient to contact the office at her earliest convenience to reschedule her appointment. ?

## 2022-01-24 ENCOUNTER — Ambulatory Visit: Payer: Medicaid Other

## 2022-01-24 ENCOUNTER — Ambulatory Visit: Payer: Medicaid Other | Attending: Maternal & Fetal Medicine

## 2022-03-02 ENCOUNTER — Inpatient Hospital Stay (HOSPITAL_COMMUNITY)
Admission: AD | Admit: 2022-03-02 | Discharge: 2022-03-02 | Disposition: A | Discharge status: Home / Self Care | Payer: Medicaid Other | Attending: Obstetrics and Gynecology | Admitting: Obstetrics and Gynecology

## 2022-03-02 ENCOUNTER — Other Ambulatory Visit: Payer: Self-pay

## 2022-03-02 ENCOUNTER — Encounter (HOSPITAL_COMMUNITY): Payer: Self-pay | Admitting: Obstetrics and Gynecology

## 2022-03-02 ENCOUNTER — Other Ambulatory Visit (INDEPENDENT_AMBULATORY_CARE_PROVIDER_SITE_OTHER): Payer: Medicaid Other | Admitting: Advanced Practice Midwife

## 2022-03-02 DIAGNOSIS — Z3A38 38 weeks gestation of pregnancy: Secondary | ICD-10-CM | POA: Diagnosis not present

## 2022-03-02 DIAGNOSIS — B3731 Acute candidiasis of vulva and vagina: Secondary | ICD-10-CM | POA: Diagnosis not present

## 2022-03-02 DIAGNOSIS — R102 Pelvic and perineal pain: Secondary | ICD-10-CM | POA: Insufficient documentation

## 2022-03-02 DIAGNOSIS — O0933 Supervision of pregnancy with insufficient antenatal care, third trimester: Secondary | ICD-10-CM | POA: Insufficient documentation

## 2022-03-02 DIAGNOSIS — O0930 Supervision of pregnancy with insufficient antenatal care, unspecified trimester: Secondary | ICD-10-CM

## 2022-03-02 DIAGNOSIS — O26893 Other specified pregnancy related conditions, third trimester: Secondary | ICD-10-CM | POA: Insufficient documentation

## 2022-03-02 DIAGNOSIS — Z3689 Encounter for other specified antenatal screening: Secondary | ICD-10-CM

## 2022-03-02 LAB — WET PREP, GENITAL
Clue Cells Wet Prep HPF POC: NONE SEEN
Sperm: NONE SEEN
Trich, Wet Prep: NONE SEEN
WBC, Wet Prep HPF POC: >=10 — AB (ref <10)
Yeast Wet Prep HPF POC: NONE SEEN

## 2022-03-02 LAB — URINALYSIS, ROUTINE W REFLEX MICROSCOPIC
Bacteria, UA: NONE SEEN
Bilirubin Urine: NEGATIVE
Glucose, UA: NEGATIVE mg/dL
Ketones, ur: NEGATIVE mg/dL
Nitrite: NEGATIVE
Protein, ur: 100 mg/dL — AB
RBC / HPF: >50 RBC/hpf — ABNORMAL HIGH (ref 0–5)
Specific Gravity, Urine: 1.025 (ref 1.005–1.030)
pH: 6 (ref 5.0–8.0)

## 2022-03-02 LAB — OB RESULTS CONSOLE GBS: GBS: NEGATIVE

## 2022-03-02 MED ORDER — ACETAMINOPHEN 500 MG PO TABS
1000.0000 mg | ORAL_TABLET | Freq: Once | ORAL | Status: AC
  Administered 2022-03-02: 1000 mg via ORAL
  Filled 2022-03-02: qty 2

## 2022-03-02 MED ORDER — CYCLOBENZAPRINE HCL 5 MG PO TABS
10.0000 mg | ORAL_TABLET | Freq: Once | ORAL | Status: AC
  Administered 2022-03-02: 10 mg via ORAL
  Filled 2022-03-02: qty 2

## 2022-03-02 MED ORDER — TERCONAZOLE 0.8 % VA CREA
1.0000 | TOPICAL_CREAM | Freq: Every day | VAGINAL | 0 refills | Status: DC
Start: 2022-03-02 — End: 2022-07-21

## 2022-03-02 MED ORDER — CYCLOBENZAPRINE HCL 10 MG PO TABS: 10.0000 mg | ORAL_TABLET | Freq: Three times a day (TID) | ORAL | 0 refills | Status: DC | PRN

## 2022-03-02 NOTE — Progress Notes (Signed)
Orders placed for elective IOL due to social and child care constraints, per patient request. GBS collected today, result not available when orders were placed.   Clayton Bibles, MSA, MSN, CNM Certified Nurse Midwife, Biochemist, clinical for [employer], [facility]

## 2022-03-02 NOTE — MAU Note (Signed)
.  Christina Willis is a 26 y.o. at [redacted]w[redacted]d here in MAU reporting: c/o sharp vaginal pain and yellow vaginal discharge for a few days. Stated her left leg goes completely numb at times. Last time was last night. Good fetal movement  reports occasional mild ctx a few times during the night.  LMP:  Onset of complaint: 2-3 days Pain score: 7 Vitals:   03/02/22 1747  BP: 124/65  Pulse: (!) 101  Resp: 18  Temp: 98.4 F (36.9 C)     FHT:140 Lab orders placed from triage:  u/a

## 2022-03-02 NOTE — MAU Provider Note (Signed)
History    CSN: [id number]  Arrival date and time: 03/02/22 1715  Event Date/Time  First Provider Initiated Contact with Patient 03/02/22 1804     Chief Complaint  Patient presents with   Vaginal Discharge   HPI Clyde Upshaw is a 26 y.o. [id number] at [redacted]w[redacted]d who presents to MAU with chief complaint of abnormal vaginal discharge. This is a recurrent problem, onset "a few days ago".  She does not believe her water is broken. She denies vaginal bleeding, leaking of fluid, decreased fetal movement, fever, falls, or recent illness.   Patient also reports pelvic pain and tingling in her lower extremities. This is a recurrent problem, most recent episode last night. Tingling sensation resolves with movement. She denies activity intolerance.   Patient's pregnancy is complicated by insufficient prenatal care. She has been seen in the office once and attended two MAU visits.   OB History     Gravida  4   Para  3   Term  3   Preterm      AB      Living  3      SAB      IAB      Ectopic      Multiple  0   Live Births  3           Past Medical History:  Diagnosis Date   Alpha thalassemia silent carrier 06/14/2019   Aa/a- [ ]  FOB testing and Genetic counseling recommended   Asthma    GBS (group B Streptococcus carrier), +RV culture, currently pregnant 09/21/2020    Past Surgical History:  Procedure Laterality Date   NO PAST SURGERIES      Family History  Problem Relation Age of Onset   Cancer Neg Hx    Diabetes Neg Hx    Hypertension Neg Hx     Social History   Tobacco Use   Smoking status: Never   Smokeless tobacco: Never  Vaping Use   Vaping Use: Never used  Substance Use Topics   Alcohol use: No   Drug use: No    Allergies:  Allergies  Allergen Reactions   Apple Juice Itching   Banana Itching   Peanut-Containing Drug Products Swelling    Raw peanuts    Medications Prior to Admission  Medication Sig Dispense Refill Last Dose    albuterol (VENTOLIN HFA) 108 (90 Base) MCG/ACT inhaler Inhale 2 puffs into the lungs every 4 (four) hours as needed for wheezing or shortness of breath (asthma). Reported on 04/04/2016 (Patient not taking: Reported on 12/27/2021)      busPIRone (BUSPAR) 5 MG tablet Take 1 tablet (5 mg total) by mouth 3 (three) times daily as needed. (Patient not taking: Reported on 12/27/2021) 90 tablet 3    hydrOXYzine (VISTARIL) 25 MG capsule Take 1 capsule (25 mg total) by mouth 3 (three) times daily as needed. 30 capsule 3    naloxone (NARCAN) nasal spray 4 mg/0.1 mL Use as needed to reverse opioid overdose 2 each 1    pantoprazole (PROTONIX) 20 MG tablet Take 1 tablet (20 mg total) by mouth daily. 30 tablet 3    Prenatal Vit-Fe Fumarate-FA (MULTIVITAMIN-PRENATAL) 27-0.8 MG TABS tablet Take 1 tablet by mouth daily at 12 noon. (Patient not taking: Reported on 12/27/2021) 30 tablet 0    [DISCONTINUED] cyclobenzaprine (FLEXERIL) 10 MG tablet Take 1 tablet (10 mg total) by mouth 3 (three) times daily as needed for muscle spasms. (Patient not taking: Reported on 12/27/2021)  20 tablet 0     Review of Systems  Genitourinary:  Positive for pelvic pain and vaginal discharge.  Musculoskeletal:  Positive for back pain.  All other systems reviewed and are negative.  Physical Exam   Blood pressure 121/77, pulse (!) 107, temperature 98.4 F (36.9 C), resp. rate 18, height 5\' 5"  (1.651 m), weight 94.8 kg, last menstrual period 12/14/2020, SpO2 98 %, not currently breastfeeding.  Physical Exam Vitals and nursing note reviewed. Exam conducted with a chaperone present.  Constitutional:      Appearance: Normal appearance. She is not ill-appearing.  Cardiovascular:     Rate and Rhythm: Normal rate and regular rhythm.     Pulses: Normal pulses.     Heart sounds: Normal heart sounds.  Pulmonary:     Effort: Pulmonary effort is normal.     Breath sounds: Normal breath sounds.  Abdominal:     Comments: Gravid  Genitourinary:     Comments: Thick white clumped vaginal discharge noted on wet prep. Suspicious for yeast Musculoskeletal:     Right lower leg: 1+ Edema present.     Left lower leg: 1+ Edema present.  Skin:    Capillary Refill: Capillary refill takes less than 2 seconds.  Neurological:     Mental Status: She is alert and oriented to person, place, and time.  Psychiatric:        Mood and Affect: Mood normal.        Behavior: Behavior normal.        Thought Content: Thought content normal.        Judgment: Judgment normal.     MAU Course  Procedures  MDM  --Patient not available for prenatal visits between now and her due date. Discussed scheduling elective IOL to help with life logistics. Patient in agreement with this and feels this will help her manage stress --Reactive tracing: baseline 140, mod var, + accels, no decels --Toco: UI --Cervix 1.5-2/70/-3, cephalic by suture  Patient Vitals for the past 24 hrs:  BP Temp Pulse Resp SpO2 Height Weight  03/02/22 1924 129/75 -- (!) 101 16 -- -- --  03/02/22 1801 121/77 -- (!) 107 -- 98 % -- --  03/02/22 1747 124/65 98.4 F (36.9 C) (!) 101 18 -- 5\' 5"  (1.651 m) 94.8 kg   Results for orders placed or performed during the hospital encounter of 03/02/22 (from the past 24 hour(s))  Urinalysis, Routine w reflex microscopic Urine, Clean Catch     Status: Abnormal   Collection Time: 03/02/22  5:49 PM  Result Value Ref Range   Color, Urine AMBER (A) YELLOW   APPearance CLOUDY (A) CLEAR   Specific Gravity, Urine 1.025 1.005 - 1.030   pH 6.0 5.0 - 8.0   Glucose, UA NEGATIVE NEGATIVE mg/dL   Hgb urine dipstick LARGE (A) NEGATIVE   Bilirubin Urine NEGATIVE NEGATIVE   Ketones, ur NEGATIVE NEGATIVE mg/dL   Protein, ur 102100 (A) NEGATIVE mg/dL   Nitrite NEGATIVE NEGATIVE   Leukocytes,Ua TRACE (A) NEGATIVE   RBC / HPF >50 (H) 0 - 5 RBC/hpf   WBC, UA 6-10 0 - 5 WBC/hpf   Bacteria, UA NONE SEEN NONE SEEN   Mucus PRESENT   Wet prep, genital     Status:  Abnormal   Collection Time: 03/02/22  6:15 PM  Result Value Ref Range   Yeast Wet Prep HPF POC NONE SEEN NONE SEEN   Trich, Wet Prep NONE SEEN NONE SEEN   Clue Cells Wet  Prep HPF POC NONE SEEN NONE SEEN   WBC, Wet Prep HPF POC >=10 (A) <10   Sperm NONE SEEN    Meds ordered this encounter  Medications   acetaminophen (TYLENOL) tablet 1,000 mg   cyclobenzaprine (FLEXERIL) tablet 10 mg   cyclobenzaprine (FLEXERIL) 10 MG tablet    Sig: Take 1 tablet (10 mg total) by mouth 3 (three) times daily as needed for muscle spasms.    Dispense:  90 tablet    Refill:  0    Order Specific Question:   Supervising Provider    Answer:   [place] Bing [[id number]]   terconazole (TERAZOL 3) 0.8 % vaginal cream    Sig: Place 1 applicator vaginally at bedtime. Apply nightly for three nights.    Dispense:  20 g    Refill:  0    Order Specific Question:   Supervising Provider    Answer:   [place] Bing [id number]   Assessment and Plan  --26 y.o. [id number] at [redacted]w[redacted]d  --Reactive tracing --Pelvic pain in third trimester --Insufficient PNC --GBS and GC/C collected in MAU --Urine culture in work --Discharge home in stable condition with labor precautions  F/U: --Elective IOL scheduled for 39.0 on 06/16 (daytime).  --Patient knows to wait for phone call, then has one hour to arrive on L&D  Calvert Cantor, MSA, MSN, CNM 03/02/2022, 7:56 PM

## 2022-03-04 ENCOUNTER — Telehealth (HOSPITAL_COMMUNITY): Payer: Self-pay | Admitting: *Deleted

## 2022-03-04 ENCOUNTER — Encounter (HOSPITAL_COMMUNITY): Payer: Self-pay

## 2022-03-04 LAB — CULTURE, OB URINE: Culture: 40000 — AB

## 2022-03-04 LAB — GC/CHLAMYDIA PROBE AMP (~~LOC~~) NOT AT ARMC
Chlamydia: NEGATIVE
Comment: NEGATIVE
Comment: NORMAL
Neisseria Gonorrhea: NEGATIVE

## 2022-03-04 NOTE — Telephone Encounter (Signed)
Preadmission screen  

## 2022-03-05 ENCOUNTER — Encounter (HOSPITAL_COMMUNITY): Payer: Self-pay | Admitting: *Deleted

## 2022-03-05 ENCOUNTER — Telehealth (HOSPITAL_COMMUNITY): Payer: Self-pay | Admitting: *Deleted

## 2022-03-05 NOTE — Telephone Encounter (Signed)
Preadmission screen  

## 2022-03-06 ENCOUNTER — Telehealth (HOSPITAL_COMMUNITY): Payer: Self-pay | Admitting: *Deleted

## 2022-03-06 ENCOUNTER — Other Ambulatory Visit: Payer: Self-pay | Admitting: Advanced Practice Midwife

## 2022-03-06 LAB — CULTURE, BETA STREP (GROUP B ONLY)

## 2022-03-06 NOTE — Telephone Encounter (Signed)
Preadmission screen  

## 2022-03-08 ENCOUNTER — Encounter (HOSPITAL_COMMUNITY): Payer: Self-pay | Admitting: Family Medicine

## 2022-03-08 ENCOUNTER — Other Ambulatory Visit: Payer: Self-pay

## 2022-03-08 ENCOUNTER — Observation Stay (HOSPITAL_COMMUNITY)
Admission: AD | Admit: 2022-03-08 | Discharge: 2022-03-08 | Disposition: A | Discharge status: Home / Self Care | Payer: Medicaid Other | Attending: Family Medicine | Admitting: Family Medicine

## 2022-03-08 ENCOUNTER — Inpatient Hospital Stay (HOSPITAL_COMMUNITY): Payer: Medicaid Other

## 2022-03-08 DIAGNOSIS — Z91018 Allergy to other foods: Secondary | ICD-10-CM

## 2022-03-08 DIAGNOSIS — O0930 Supervision of pregnancy with insufficient antenatal care, unspecified trimester: Secondary | ICD-10-CM

## 2022-03-08 DIAGNOSIS — O322XX0 Maternal care for transverse and oblique lie, not applicable or unspecified: Principal | ICD-10-CM | POA: Insufficient documentation

## 2022-03-08 DIAGNOSIS — Z3490 Encounter for supervision of normal pregnancy, unspecified, unspecified trimester: Secondary | ICD-10-CM | POA: Diagnosis present

## 2022-03-08 DIAGNOSIS — O2343 Unspecified infection of urinary tract in pregnancy, third trimester: Secondary | ICD-10-CM

## 2022-03-08 DIAGNOSIS — Z91010 Allergy to peanuts: Secondary | ICD-10-CM | POA: Insufficient documentation

## 2022-03-08 DIAGNOSIS — Z87440 Personal history of urinary (tract) infections: Secondary | ICD-10-CM | POA: Diagnosis not present

## 2022-03-08 DIAGNOSIS — Z3A39 39 weeks gestation of pregnancy: Secondary | ICD-10-CM | POA: Diagnosis not present

## 2022-03-08 LAB — URINALYSIS, ROUTINE W REFLEX MICROSCOPIC
Bilirubin Urine: NEGATIVE
Glucose, UA: NEGATIVE mg/dL
Hgb urine dipstick: NEGATIVE
Ketones, ur: 5 mg/dL — AB
Leukocytes,Ua: NEGATIVE
Nitrite: NEGATIVE
Protein, ur: NEGATIVE mg/dL
Specific Gravity, Urine: 1.016 (ref 1.005–1.030)
pH: 6 (ref 5.0–8.0)

## 2022-03-08 LAB — CBC
HCT: 35.9 % — ABNORMAL LOW (ref 36.0–46.0)
Hemoglobin: 11.5 g/dL — ABNORMAL LOW (ref 12.0–15.0)
MCH: 27.6 pg (ref 26.0–34.0)
MCHC: 32 g/dL (ref 30.0–36.0)
MCV: 86.1 fL (ref 80.0–100.0)
Platelets: 218 10*3/uL (ref 150–400)
RBC: 4.17 MIL/uL (ref 3.87–5.11)
RDW: 15.4 % (ref 11.5–15.5)
WBC: 6.9 10*3/uL (ref 4.0–10.5)
nRBC: 0 % (ref 0.0–0.2)

## 2022-03-08 LAB — RPR: RPR Ser Ql: NONREACTIVE

## 2022-03-08 MED ORDER — SOD CITRATE-CITRIC ACID 500-334 MG/5ML PO SOLN: 30.0000 mL | ORAL | Status: DC | PRN

## 2022-03-08 MED ORDER — FLUCONAZOLE 150 MG PO TABS: 150.0000 mg | ORAL_TABLET | Freq: Every day | ORAL | 1 refills | Status: DC

## 2022-03-08 MED ORDER — ONDANSETRON HCL 4 MG/2ML IJ SOLN: 4.0000 mg | Freq: Four times a day (QID) | INTRAMUSCULAR | Status: DC | PRN

## 2022-03-08 MED ORDER — OXYTOCIN BOLUS FROM INFUSION: 333.0000 mL | Freq: Once | INTRAVENOUS | Status: DC

## 2022-03-08 MED ORDER — OXYTOCIN-SODIUM CHLORIDE 30-0.9 UT/500ML-% IV SOLN: 1.0000 m[IU]/min | INTRAVENOUS | Status: DC

## 2022-03-08 MED ORDER — OXYTOCIN-SODIUM CHLORIDE 30-0.9 UT/500ML-% IV SOLN: 2.5000 [IU]/h | INTRAVENOUS | Status: DC

## 2022-03-08 MED ORDER — LACTATED RINGERS IV SOLN: 500.0000 mL | INTRAVENOUS | Status: DC | PRN

## 2022-03-08 MED ORDER — CEFADROXIL 500 MG PO CAPS
500.0000 mg | ORAL_CAPSULE | Freq: Two times a day (BID) | ORAL | 0 refills | Status: DC
Start: 2022-03-08 — End: 2022-07-21

## 2022-03-08 MED ORDER — ACETAMINOPHEN 325 MG PO TABS: 650.0000 mg | ORAL_TABLET | ORAL | Status: DC | PRN

## 2022-03-08 MED ORDER — TERBUTALINE SULFATE 1 MG/ML IJ SOLN: 0.2500 mg | Freq: Once | INTRAMUSCULAR | Status: DC | PRN

## 2022-03-08 MED ORDER — LACTATED RINGERS IV SOLN: INTRAVENOUS | Status: DC

## 2022-03-08 MED ORDER — LIDOCAINE HCL (PF) 1 % IJ SOLN: 30.0000 mL | INTRAMUSCULAR | Status: DC | PRN

## 2022-03-08 NOTE — Discharge Instructions (Signed)
Please spend time on hands/knees doing pelvic tilts to help baby turn to head down.

## 2022-03-08 NOTE — Discharge Summary (Signed)
Postpartum Discharge Summary    Patient Name: Christina Willis DOB: [birth year] MRN: [id number]  Date of admission: 03/08/2022 Delivery date:This patient has no babies on file. Delivering provider: This patient has no babies on file. Date of discharge: 03/08/2022  Admitting diagnosis: Encounter for elective induction of labor [Z34.90] Intrauterine pregnancy: [redacted]w[redacted]d    Secondary diagnosis:  Principal Problem:   Encounter for elective induction of labor  Additional problems: None    Discharge diagnosis:  Transverse presentation, discharge home with ECV/IOL scheduled for Monday, 03/11/22                                               Postpartum procedures: None, still pregnant Augmentation: N/A Complications: None  Hospital course: Scanned for presentation at beginning of IOL and found to be transverse presentation. Pt had eaten this morning so it would be 8hrs before an attempted ECV. Used shared decision making and pt agreed to discharge with ECV scheduled on Monday. Will IOL if ECV successful, CS if unsuccessful.  Pt reported UTI symptoms consistent with her last UTI/pyelo. Last UA on 6/10 = 40,000 colonies mixed bacteria. New UA ordered but will discharge on duricef given history of UTIs and presentation.  Magnesium Sulfate received: No BMZ received: No Rhophylac:N/A MMR:N/A T-DaP:Given prenatally Flu: N/A Transfusion:No  Physical exam  Vitals:   03/08/22 0628  BP: 130/86  Pulse: 99  Resp: 16  Temp: 98 F (36.7 C)  TempSrc: Oral  Weight: 215 lb 14.4 oz (97.9 kg)  Height: 5' 5"  (1.651 m)   General: alert, cooperative, and no distress Lochia: none Uterine Fundus: appropriate for gestational age Incision: N/A DVT Evaluation: No evidence of DVT seen on physical exam. Labs: Lab Results  Component Value Date   WBC 6.9 03/08/2022   HGB 11.5 (L) 03/08/2022   HCT 35.9 (L) 03/08/2022   MCV 86.1 03/08/2022   PLT 218 03/08/2022      Latest Ref Rng & Units 11/22/2021    12:00 PM  CMP  Glucose 70 - 99 mg/dL 86   BUN 6 - 20 mg/dL 5   Creatinine 0.44 - 1.00 mg/dL 0.56   Sodium 135 - 145 mmol/L 136   Potassium 3.5 - 5.1 mmol/L 3.4   Chloride 98 - 111 mmol/L 104   CO2 22 - 32 mmol/L 24   Calcium 8.9 - 10.3 mg/dL 8.6   Total Protein 6.5 - 8.1 g/dL 6.5   Total Bilirubin 0.3 - 1.2 mg/dL 0.2   Alkaline Phos 38 - 126 U/L 58   AST 15 - 41 U/L 22   ALT 0 - 44 U/L 20    Edinburgh Score:    09/22/2020    4:06 AM  Edinburgh Postnatal Depression Scale Screening Tool  I have been able to laugh and see the funny side of things. 0  I have looked forward with enjoyment to things. 0  I have blamed myself unnecessarily when things went wrong. 2  I have been anxious or worried for no good reason. 0  I have felt scared or panicky for no good reason. 0  Things have been getting on top of me. 0  I have been so unhappy that I have had difficulty sleeping. 0  I have felt sad or miserable. 1  I have been so unhappy that I have been crying. 1  The thought of  harming myself has occurred to me. 0  Edinburgh Postnatal Depression Scale Total 4     After visit meds:  Allergies as of 03/08/2022       Reactions   Apple Juice Itching   Raw apples   Banana Itching   Peanut-containing Drug Products Swelling   Raw peanuts   Black Walnut Flavor Itching   Raw walnuts        Medication List     STOP taking these medications    busPIRone 5 MG tablet Commonly known as: BUSPAR       TAKE these medications    albuterol 108 (90 Base) MCG/ACT inhaler Commonly known as: VENTOLIN HFA Inhale 2 puffs into the lungs every 4 (four) hours as needed for wheezing or shortness of breath (asthma). Reported on 04/04/2016   cefadroxil 500 MG capsule Commonly known as: DURICEF Take 1 capsule (500 mg total) by mouth 2 (two) times daily.   cyclobenzaprine 10 MG tablet Commonly known as: FLEXERIL Take 1 tablet (10 mg total) by mouth 3 (three) times daily as needed for muscle  spasms.   fluconazole 150 MG tablet Commonly known as: Diflucan Take 1 tablet (150 mg total) by mouth daily.   hydrOXYzine 25 MG capsule Commonly known as: Vistaril Take 1 capsule (25 mg total) by mouth 3 (three) times daily as needed.   multivitamin-prenatal 27-0.8 MG Tabs tablet Take 1 tablet by mouth daily at 12 noon.   naloxone 4 MG/0.1ML Liqd nasal spray kit Commonly known as: NARCAN Use as needed to reverse opioid overdose   pantoprazole 20 MG tablet Commonly known as: PROTONIX Take 1 tablet (20 mg total) by mouth daily.   terconazole 0.8 % vaginal cream Commonly known as: TERAZOL 3 Place 1 applicator vaginally at bedtime. Apply nightly for three nights.       Discharge home in stable condition  Future Appointments: Future Appointments  Date Time Provider [place]  03/11/2022  8:00 AM [facility] [place] None   03/08/2022 Gabriel Carina, CNM

## 2022-03-09 LAB — TYPE AND SCREEN
ABO/RH(D): O NEG
Antibody Screen: POSITIVE
Unit division: 0
Unit division: 0

## 2022-03-09 LAB — BPAM RBC
Blood Product Expiration Date: 202306272359
Blood Product Expiration Date: 202306272359
ISSUE DATE / TIME: 202306101518
Unit Type and Rh: 9500
Unit Type and Rh: 9500

## 2022-03-11 ENCOUNTER — Other Ambulatory Visit (HOSPITAL_COMMUNITY): Payer: Self-pay

## 2022-03-11 ENCOUNTER — Inpatient Hospital Stay (HOSPITAL_COMMUNITY): Payer: Medicaid Other

## 2022-03-11 ENCOUNTER — Inpatient Hospital Stay (HOSPITAL_COMMUNITY): Payer: Medicaid Other | Admitting: Anesthesiology

## 2022-03-11 ENCOUNTER — Other Ambulatory Visit: Payer: Self-pay

## 2022-03-11 ENCOUNTER — Encounter (HOSPITAL_COMMUNITY): Payer: Self-pay | Admitting: Family Medicine

## 2022-03-11 ENCOUNTER — Encounter (HOSPITAL_COMMUNITY): Payer: Self-pay | Admitting: Anesthesiology

## 2022-03-11 ENCOUNTER — Other Ambulatory Visit (HOSPITAL_COMMUNITY): Payer: Self-pay | Admitting: General Practice

## 2022-03-11 ENCOUNTER — Inpatient Hospital Stay (HOSPITAL_COMMUNITY)
Admission: AD | Admit: 2022-03-11 | Discharge: 2022-03-13 | DRG: 806 | Disposition: A | Discharge status: Home / Self Care | Payer: Medicaid Other | Attending: Family Medicine | Admitting: Family Medicine

## 2022-03-11 DIAGNOSIS — O99210 Obesity complicating pregnancy, unspecified trimester: Secondary | ICD-10-CM | POA: Diagnosis present

## 2022-03-11 DIAGNOSIS — D563 Thalassemia minor: Secondary | ICD-10-CM | POA: Diagnosis present

## 2022-03-11 DIAGNOSIS — Z91018 Allergy to other foods: Secondary | ICD-10-CM

## 2022-03-11 DIAGNOSIS — O26899 Other specified pregnancy related conditions, unspecified trimester: Secondary | ICD-10-CM

## 2022-03-11 DIAGNOSIS — D62 Acute posthemorrhagic anemia: Secondary | ICD-10-CM | POA: Diagnosis not present

## 2022-03-11 DIAGNOSIS — O99320 Drug use complicating pregnancy, unspecified trimester: Secondary | ICD-10-CM | POA: Diagnosis present

## 2022-03-11 DIAGNOSIS — O26893 Other specified pregnancy related conditions, third trimester: Secondary | ICD-10-CM | POA: Diagnosis present

## 2022-03-11 DIAGNOSIS — Z6791 Unspecified blood type, Rh negative: Secondary | ICD-10-CM | POA: Diagnosis not present

## 2022-03-11 DIAGNOSIS — J45909 Unspecified asthma, uncomplicated: Secondary | ICD-10-CM | POA: Diagnosis present

## 2022-03-11 DIAGNOSIS — O99324 Drug use complicating childbirth: Secondary | ICD-10-CM | POA: Diagnosis not present

## 2022-03-11 DIAGNOSIS — O99214 Obesity complicating childbirth: Secondary | ICD-10-CM | POA: Diagnosis present

## 2022-03-11 DIAGNOSIS — Z79899 Other long term (current) drug therapy: Secondary | ICD-10-CM

## 2022-03-11 DIAGNOSIS — O0930 Supervision of pregnancy with insufficient antenatal care, unspecified trimester: Secondary | ICD-10-CM

## 2022-03-11 DIAGNOSIS — Z3A39 39 weeks gestation of pregnancy: Secondary | ICD-10-CM

## 2022-03-11 DIAGNOSIS — O361930 Maternal care for other isoimmunization, third trimester, not applicable or unspecified: Secondary | ICD-10-CM | POA: Diagnosis present

## 2022-03-11 DIAGNOSIS — O9081 Anemia of the puerperium: Secondary | ICD-10-CM | POA: Diagnosis not present

## 2022-03-11 DIAGNOSIS — Z3490 Encounter for supervision of normal pregnancy, unspecified, unspecified trimester: Secondary | ICD-10-CM | POA: Diagnosis present

## 2022-03-11 DIAGNOSIS — O9952 Diseases of the respiratory system complicating childbirth: Secondary | ICD-10-CM | POA: Diagnosis present

## 2022-03-11 DIAGNOSIS — Z91010 Allergy to peanuts: Secondary | ICD-10-CM | POA: Diagnosis not present

## 2022-03-11 DIAGNOSIS — O361990 Maternal care for other isoimmunization, unspecified trimester, not applicable or unspecified: Secondary | ICD-10-CM | POA: Diagnosis present

## 2022-03-11 LAB — CBC
HCT: 33.8 % — ABNORMAL LOW (ref 36.0–46.0)
Hemoglobin: 10.5 g/dL — ABNORMAL LOW (ref 12.0–15.0)
MCH: 27.1 pg (ref 26.0–34.0)
MCHC: 31.1 g/dL (ref 30.0–36.0)
MCV: 87.1 fL (ref 80.0–100.0)
Platelets: 202 10*3/uL (ref 150–400)
RBC: 3.88 MIL/uL (ref 3.87–5.11)
RDW: 15.6 % — ABNORMAL HIGH (ref 11.5–15.5)
WBC: 7.2 10*3/uL (ref 4.0–10.5)
nRBC: 0 % (ref 0.0–0.2)

## 2022-03-11 MED ORDER — OXYTOCIN-SODIUM CHLORIDE 30-0.9 UT/500ML-% IV SOLN
INTRAVENOUS | Status: AC
  Filled 2022-03-11: qty 500

## 2022-03-11 MED ORDER — ZOLPIDEM TARTRATE 5 MG PO TABS: 5.0000 mg | ORAL_TABLET | Freq: Every evening | ORAL | Status: DC | PRN

## 2022-03-11 MED ORDER — ONDANSETRON HCL 4 MG PO TABS: 4.0000 mg | ORAL_TABLET | ORAL | Status: DC | PRN

## 2022-03-11 MED ORDER — IBUPROFEN 600 MG PO TABS
600.0000 mg | ORAL_TABLET | Freq: Four times a day (QID) | ORAL | Status: DC
  Administered 2022-03-11 – 2022-03-13 (×7): 600 mg via ORAL
  Filled 2022-03-11 (×6): qty 1

## 2022-03-11 MED ORDER — SOD CITRATE-CITRIC ACID 500-334 MG/5ML PO SOLN: 30.0000 mL | ORAL | Status: DC | PRN

## 2022-03-11 MED ORDER — LACTATED RINGERS IV SOLN
INTRAVENOUS | Status: DC
Start: 2022-03-11 — End: 2022-03-11

## 2022-03-11 MED ORDER — LIDOCAINE HCL (PF) 1 % IJ SOLN: 30.0000 mL | INTRAMUSCULAR | Status: DC | PRN

## 2022-03-11 MED ORDER — OXYCODONE-ACETAMINOPHEN 5-325 MG PO TABS: 1.0000 | ORAL_TABLET | ORAL | Status: DC | PRN

## 2022-03-11 MED ORDER — DIPHENHYDRAMINE HCL 25 MG PO CAPS
25.0000 mg | ORAL_CAPSULE | Freq: Four times a day (QID) | ORAL | Status: DC | PRN
  Administered 2022-03-11: 25 mg via ORAL
  Filled 2022-03-11: qty 1

## 2022-03-11 MED ORDER — TERBUTALINE SULFATE 1 MG/ML IJ SOLN: 0.2500 mg | Freq: Once | INTRAMUSCULAR | Status: DC

## 2022-03-11 MED ORDER — BENZOCAINE-MENTHOL 20-0.5 % EX AERO: 1.0000 | INHALATION_SPRAY | CUTANEOUS | Status: DC | PRN

## 2022-03-11 MED ORDER — OXYTOCIN BOLUS FROM INFUSION: 333.0000 mL | Freq: Once | INTRAVENOUS | Status: DC

## 2022-03-11 MED ORDER — ONDANSETRON HCL 4 MG/2ML IJ SOLN: 4.0000 mg | Freq: Four times a day (QID) | INTRAMUSCULAR | Status: DC | PRN

## 2022-03-11 MED ORDER — OXYTOCIN-SODIUM CHLORIDE 30-0.9 UT/500ML-% IV SOLN: 2.5000 [IU]/h | INTRAVENOUS | Status: DC

## 2022-03-11 MED ORDER — LACTATED RINGERS IV SOLN: 500.0000 mL | INTRAVENOUS | Status: DC | PRN

## 2022-03-11 MED ORDER — DIPHENHYDRAMINE HCL 50 MG/ML IJ SOLN: 12.5000 mg | INTRAMUSCULAR | Status: DC | PRN

## 2022-03-11 MED ORDER — SENNOSIDES-DOCUSATE SODIUM 8.6-50 MG PO TABS
2.0000 | ORAL_TABLET | Freq: Every day | ORAL | Status: DC
  Administered 2022-03-12 – 2022-03-13 (×2): 2 via ORAL
  Filled 2022-03-11 (×2): qty 2

## 2022-03-11 MED ORDER — OXYCODONE-ACETAMINOPHEN 5-325 MG PO TABS: 2.0000 | ORAL_TABLET | ORAL | Status: DC | PRN

## 2022-03-11 MED ORDER — OXYTOCIN-SODIUM CHLORIDE 30-0.9 UT/500ML-% IV SOLN: 1.0000 m[IU]/min | INTRAVENOUS | Status: DC

## 2022-03-11 MED ORDER — PHENYLEPHRINE 80 MCG/ML (10ML) SYRINGE FOR IV PUSH (FOR BLOOD PRESSURE SUPPORT)
80.0000 ug | PREFILLED_SYRINGE | INTRAVENOUS | Status: DC | PRN
Start: 2022-03-11 — End: 2022-03-11

## 2022-03-11 MED ORDER — ACETAMINOPHEN 325 MG PO TABS: 650.0000 mg | ORAL_TABLET | ORAL | Status: DC | PRN

## 2022-03-11 MED ORDER — TERBUTALINE SULFATE 1 MG/ML IJ SOLN: 0.2500 mg | Freq: Once | INTRAMUSCULAR | Status: DC | PRN

## 2022-03-11 MED ORDER — PHENYLEPHRINE 80 MCG/ML (10ML) SYRINGE FOR IV PUSH (FOR BLOOD PRESSURE SUPPORT)
80.0000 ug | PREFILLED_SYRINGE | INTRAVENOUS | Status: DC | PRN
  Filled 2022-03-11: qty 10

## 2022-03-11 MED ORDER — TETANUS-DIPHTH-ACELL PERTUSSIS 5-2.5-18.5 LF-MCG/0.5 IM SUSY: 0.5000 mL | PREFILLED_SYRINGE | Freq: Once | INTRAMUSCULAR | Status: DC

## 2022-03-11 MED ORDER — FENTANYL CITRATE (PF) 100 MCG/2ML IJ SOLN: 50.0000 ug | INTRAMUSCULAR | Status: DC | PRN

## 2022-03-11 MED ORDER — ONDANSETRON HCL 4 MG/2ML IJ SOLN: 4.0000 mg | INTRAMUSCULAR | Status: DC | PRN

## 2022-03-11 MED ORDER — DIBUCAINE (PERIANAL) 1 % EX OINT: 1.0000 | TOPICAL_OINTMENT | CUTANEOUS | Status: DC | PRN

## 2022-03-11 MED ORDER — LACTATED RINGERS IV SOLN
500.0000 mL | Freq: Once | INTRAVENOUS | Status: AC
  Administered 2022-03-11: 500 mL via INTRAVENOUS

## 2022-03-11 MED ORDER — LIDOCAINE-EPINEPHRINE (PF) 2 %-1:200000 IJ SOLN
INTRAMUSCULAR | Status: DC | PRN
  Administered 2022-03-11: 5 mL via EPIDURAL

## 2022-03-11 MED ORDER — WITCH HAZEL-GLYCERIN EX PADS
1.0000 | MEDICATED_PAD | CUTANEOUS | Status: DC | PRN
  Administered 2022-03-11: 1 via TOPICAL

## 2022-03-11 MED ORDER — ACETAMINOPHEN 325 MG PO TABS
650.0000 mg | ORAL_TABLET | ORAL | Status: DC | PRN
  Administered 2022-03-12 – 2022-03-13 (×2): 650 mg via ORAL
  Filled 2022-03-11 (×2): qty 2

## 2022-03-11 MED ORDER — FENTANYL-BUPIVACAINE-NACL 0.5-0.125-0.9 MG/250ML-% EP SOLN
12.0000 mL/h | EPIDURAL | Status: DC | PRN
  Administered 2022-03-11: 12 mL/h via EPIDURAL
  Filled 2022-03-11: qty 250

## 2022-03-11 MED ORDER — COCONUT OIL OIL: 1.0000 | TOPICAL_OIL | Status: DC | PRN

## 2022-03-11 MED ORDER — PRENATAL MULTIVITAMIN CH
1.0000 | ORAL_TABLET | Freq: Every day | ORAL | Status: DC
  Administered 2022-03-12 – 2022-03-13 (×2): 1 via ORAL
  Filled 2022-03-11 (×2): qty 1

## 2022-03-11 MED ORDER — OXYTOCIN-SODIUM CHLORIDE 30-0.9 UT/500ML-% IV SOLN
2.5000 [IU]/h | INTRAVENOUS | Status: DC
  Administered 2022-03-11: 2.5 [IU]/h via INTRAVENOUS

## 2022-03-11 MED ORDER — EPHEDRINE 5 MG/ML INJ: 10.0000 mg | INTRAVENOUS | Status: DC | PRN

## 2022-03-11 MED ORDER — LACTATED RINGERS IV SOLN: INTRAVENOUS | Status: DC

## 2022-03-11 MED ORDER — MISOPROSTOL 200 MCG PO TABS: 50.0000 ug | ORAL_TABLET | ORAL | Status: DC | PRN

## 2022-03-11 MED ORDER — OXYTOCIN-SODIUM CHLORIDE 30-0.9 UT/500ML-% IV SOLN
1.0000 m[IU]/min | INTRAVENOUS | Status: DC
  Administered 2022-03-11: 2 m[IU]/min via INTRAVENOUS

## 2022-03-11 MED ORDER — SIMETHICONE 80 MG PO CHEW: 80.0000 mg | CHEWABLE_TABLET | ORAL | Status: DC | PRN

## 2022-03-11 MED ORDER — EPHEDRINE 5 MG/ML INJ
10.0000 mg | INTRAVENOUS | Status: DC | PRN
Start: 2022-03-11 — End: 2022-03-11

## 2022-03-11 MED ORDER — FLEET ENEMA 7-19 GM/118ML RE ENEM: 1.0000 | ENEMA | RECTAL | Status: DC | PRN

## 2022-03-11 MED ORDER — OXYTOCIN BOLUS FROM INFUSION
333.0000 mL | Freq: Once | INTRAVENOUS | Status: AC
  Administered 2022-03-11: 333 mL via INTRAVENOUS

## 2022-03-11 NOTE — Anesthesia Preprocedure Evaluation (Signed)
Anesthesia Evaluation  Patient identified by MRN, date of birth, ID band Patient awake    Reviewed: Allergy & Precautions, NPO status , Patient's Chart, lab work & pertinent test results  Airway Mallampati: II  TM Distance: >3 FB Neck ROM: Full    Dental no notable dental hx.    Pulmonary asthma ,    Pulmonary exam normal breath sounds clear to auscultation       Cardiovascular negative cardio ROS Normal cardiovascular exam Rhythm:Regular Rate:Normal     Neuro/Psych negative neurological ROS  negative psych ROS   GI/Hepatic negative GI ROS, (+)     substance abuse  alcohol use,   Endo/Other  negative endocrine ROS  Renal/GU negative Renal ROS  negative genitourinary   Musculoskeletal negative musculoskeletal ROS (+) narcotic dependent  Abdominal   Peds  Hematology negative hematology ROS (+)   Anesthesia Other Findings Elective IOL  Reproductive/Obstetrics (+) Pregnancy                             Anesthesia Physical Anesthesia Plan  ASA: 2  Anesthesia Plan: Epidural   Post-op Pain Management:    Induction:   PONV Risk Score and Plan: Treatment may vary due to age or medical condition  Airway Management Planned: Natural Airway  Additional Equipment:   Intra-op Plan:   Post-operative Plan:   Informed Consent: I have reviewed the patients History and Physical, chart, labs and discussed the procedure including the risks, benefits and alternatives for the proposed anesthesia with the patient or authorized representative who has indicated his/her understanding and acceptance.       Plan Discussed with: Anesthesiologist  Anesthesia Plan Comments: (Patient identified. Risks, benefits, options discussed with patient including but not limited to bleeding, infection, nerve damage, paralysis, failed block, incomplete pain control, headache, blood pressure changes, nausea,  vomiting, reactions to medication, itching, and post partum back pain. Confirmed with bedside nurse the patient's most recent platelet count. Confirmed with the patient that they are not taking any anticoagulation, have any bleeding history or any family history of bleeding disorders. Patient expressed understanding and wishes to proceed. All questions were answered. )        Anesthesia Quick Evaluation

## 2022-03-11 NOTE — Progress Notes (Signed)
Labor Progress Note Christina Willis is a 26 y.o. [phone] at [redacted]w[redacted]d who presented for eIOL.   S: Doing well. Feeling more pressure. No concerns.   O:  BP 129/83   Pulse 78   Temp 97.6 F (36.4 C) (Oral)   Resp 19   Ht 5\' 5"  (1.651 m)   Wt 97.5 kg   LMP 12/14/2020   SpO2 100%   BMI 35.78 kg/m   EFM: Baseline 135 bpm, moderate variability, no accels, variable decels  Toco: Every 2-3 minutes   CVE: Dilation: 7 Effacement (%): 90 Station: Plus 2 Presentation: Vertex Exam by:: Dr [email or web address]  A&P: 26 y.o. 22 [redacted]w[redacted]d   #Labor: Progressing well. Fetal head plus 2 but still has cervix remaining circumferentially that is not reducible. Positioned in throne. Will reassess in 1-2 hours, sooner as needed.  #Pain: Epidural  #FWB: Cat 2 due to intermittent variable decelerations. Improved with position changes. Maintaining moderate variability. Will continue to monitor closely.  #GBS negative  [redacted]w[redacted]d, MD 5:25 PM

## 2022-03-11 NOTE — Plan of Care (Signed)
Pt demonstrated understanding 

## 2022-03-11 NOTE — Progress Notes (Signed)
Labor Progress Note Christina Willis is a 26 y.o. [phone] at [redacted]w[redacted]d who presented for eIOL.   S: Doing well. Comfortable with epidural. No concerns.   O:  BP 117/76   Pulse 82   Temp 97.9 F (36.6 C) (Oral)   Resp 19   Ht 5\' 5"  (1.651 m)   Wt 97.5 kg   LMP 12/14/2020   SpO2 100%   BMI 35.78 kg/m   EFM: Baseline 125 bpm, moderate variability, + accels, occasional late decels  Toco: Every 2 minutes   CVE: Dilation: 4.5 Effacement (%): 80 Station: -1 Presentation: Vertex Exam by:: Dr [email or web address]  A&P: 26 y.o. 22 [redacted]w[redacted]d   #Labor: Progressing well. AROM discussed and patient verbally consented. AROM performed with moderate amount of blood tinged fluid. Mom and baby tolerated this well. Contracting regularly every 2 minutes. Pitocin at 6 milli-Units/min. Will continue at current rate and adjust as needed. Plan to reassess on 4-5 hours, sooner PRN.  #Pain: Epidural  #FWB: Cat 2 due to intermittent late decelerations. Improved with position changes. Reassuring variability and accels. Will continue to monitor closely.  #GBS negative  [redacted]w[redacted]d, MD 3:07 PM

## 2022-03-11 NOTE — Lactation Note (Signed)
This note was copied from a baby's chart. Lactation Consultation Note Mom chooses to formula feed.  Patient Name: Christina Willis HFGBM'S Date: 03/11/2022   Age:25 hours  Maternal Data    Feeding Nipple Type: Regular  LATCH Score                    Lactation Tools Discussed/Used    Interventions    Discharge    Consult Status Consult Status: Complete    Luva Metzger G 03/11/2022, 7:10 PM

## 2022-03-11 NOTE — Anesthesia Procedure Notes (Signed)
Epidural Patient location during procedure: OB Start time: 03/11/2022 2:15 PM End time: 03/11/2022 2:25 PM  Staffing Anesthesiologist: Elmer Picker, MD Performed: anesthesiologist   Preanesthetic Checklist Completed: patient identified, IV checked, risks and benefits discussed, monitors and equipment checked, pre-op evaluation and timeout performed  Epidural Patient position: sitting Prep: DuraPrep and site prepped and draped Patient monitoring: continuous pulse ox, blood pressure, heart rate and cardiac monitor Approach: midline Location: L3-L4 Injection technique: LOR air  Needle:  Needle type: Tuohy  Needle gauge: 17 G Needle length: 9 cm Needle insertion depth: 6 cm Catheter type: closed end flexible Catheter size: 19 Gauge Catheter at skin depth: 11 cm Test dose: negative  Assessment Sensory level: T8 Events: blood not aspirated, injection not painful, no injection resistance, no paresthesia and negative IV test  Additional Notes Patient identified. Risks/Benefits/Options discussed with patient including but not limited to bleeding, infection, nerve damage, paralysis, failed block, incomplete pain control, headache, blood pressure changes, nausea, vomiting, reactions to medication both or allergic, itching and postpartum back pain. Confirmed with bedside nurse the patient's most recent platelet count. Confirmed with patient that they are not currently taking any anticoagulation, have any bleeding history or any family history of bleeding disorders. Patient expressed understanding and wished to proceed. All questions were answered. Sterile technique was used throughout the entire procedure. Please see nursing notes for vital signs. Test dose was given through epidural catheter and negative prior to continuing to dose epidural or start infusion. Warning signs of high block given to the patient including shortness of breath, tingling/numbness in hands, complete motor block,  or any concerning symptoms with instructions to call for help. Patient was given instructions on fall risk and not to get out of bed. All questions and concerns addressed with instructions to call with any issues or inadequate analgesia.  Reason for block:procedure for pain

## 2022-03-11 NOTE — Discharge Summary (Signed)
Postpartum Discharge Summary  Date of Service updated***     Patient Name: Christina Willis DOB: 1996-09-19 MRN: [id number]  Date of admission: 03/11/2022 Delivery date:03/11/2022  Delivering provider: Deloris Ping  Date of discharge: 03/11/2022  Admitting diagnosis: Encounter for elective induction of labor [Z34.90] Pregnant [Z34.90] Intrauterine pregnancy: [redacted]w[redacted]d    Secondary diagnosis:  Principal Problem:   Encounter for elective induction of labor Active Problems:   Rh negative, antepartum   Maternal obesity syndrome, antepartum   Alpha thalassemia silent carrier   Drug use complicating pregnancy   Supervision of pregnancy with insufficient antenatal care   Lewis isoimmunization during pregnancy   Pregnant  Additional problems: ***    Discharge diagnosis: {DX.:23714}                                              Post partum procedures:{Postpartum procedures:23558} Augmentation: AROM and Pitocin Complications: None  Hospital course: Induction of Labor With Vaginal Delivery   26y.o. yo [id number] [phone] [id number] admitted to the hospital 03/11/2022 for induction of labor.  Indication for induction: Elective.  Patient had an uncomplicated labor course as follows: Membrane Rupture Time/Date: 3:00 PM ,03/11/2022   Delivery Method:Vaginal, Spontaneous  Episiotomy: None  Lacerations:  None  Details of delivery can be found in separate delivery note.  Patient had a routine postpartum course. Patient is discharged home 03/11/22.  Newborn Data: Birth date:03/11/2022  Birth time:5:58 PM  Gender:Female  Living status:Living  Apgars:8 ,9  Weight:   Magnesium Sulfate received: {Mag received:30440022} BMZ received: No Rhophylac:{Rhophylac received:30440032} MMR:N/A T-DaP: declined  Flu: N/A declined  Transfusion:{Transfusion received:30440034}  Physical exam  Vitals:   03/11/22 1726 03/11/22 1731 03/11/22 1732 03/11/22 1815  BP:   119/77 121/76  Pulse:   91 97  Resp:    16 20  Temp:    97.6 F (36.4 C)  TempSrc:      SpO2: 100% 100%    Weight:      Height:       General: {Exam; general:21111117} Lochia: {Desc; appropriate/inappropriate:30686::"appropriate"} Uterine Fundus: {Desc; firm/soft:30687} Incision: {Exam; incision:21111123} DVT Evaluation: {Exam; dvt:2111122} Labs: Lab Results  Component Value Date   WBC 7.2 03/11/2022   HGB 10.5 (L) 03/11/2022   HCT 33.8 (L) 03/11/2022   MCV 87.1 03/11/2022   PLT 202 03/11/2022      Latest Ref Rng & Units 11/22/2021   12:00 PM  CMP  Glucose 70 - 99 mg/dL 86   BUN 6 - 20 mg/dL 5   Creatinine 0.44 - 1.00 mg/dL 0.56   Sodium 135 - 145 mmol/L 136   Potassium 3.5 - 5.1 mmol/L 3.4   Chloride 98 - 111 mmol/L 104   CO2 22 - 32 mmol/L 24   Calcium 8.9 - 10.3 mg/dL 8.6   Total Protein 6.5 - 8.1 g/dL 6.5   Total Bilirubin 0.3 - 1.2 mg/dL 0.2   Alkaline Phos 38 - 126 U/L 58   AST 15 - 41 U/L 22   ALT 0 - 44 U/L 20    Edinburgh Score:    09/22/2020    4:06 AM  Edinburgh Postnatal Depression Scale Screening Tool  I have been able to laugh and see the funny side of things. 0  I have looked forward with enjoyment to things. 0  I have blamed myself unnecessarily when  things went wrong. 2  I have been anxious or worried for no good reason. 0  I have felt scared or panicky for no good reason. 0  Things have been getting on top of me. 0  I have been so unhappy that I have had difficulty sleeping. 0  I have felt sad or miserable. 1  I have been so unhappy that I have been crying. 1  The thought of harming myself has occurred to me. 0  Edinburgh Postnatal Depression Scale Total 4     After visit meds:  Allergies as of 03/11/2022       Reactions   Apple Juice Itching   Raw apples   Banana Itching   Peanut-containing Drug Products Swelling   Raw peanuts   Black Walnut Flavor Itching   Raw walnuts     Med Rec must be completed prior to using this [place]***        Discharge home in  stable condition Infant Feeding: {Baby feeding:23562} Infant Disposition:{CHL IP OB HOME WITH LDKCCQ:19012} Discharge instruction: per After Visit Summary and Postpartum booklet. Activity: Advance as tolerated. Pelvic rest for 6 weeks.  Diet: {OB QUIV:14643142} Future Appointments:No future appointments. Follow up Visit:  Message sent to [facility] on 03/11/22 @ 1830.  Please schedule this patient for a In person postpartum visit in 4 weeks with the following provider: Any provider. Additional Postpartum F/U: n/a    High risk pregnancy complicated by:  insufficient [facility] Delivery mode:  Vaginal, Spontaneous  Anticipated Birth Control:  POPs   03/11/2022 Jacquiline Doe, CNM

## 2022-03-11 NOTE — H&P (Signed)
OBSTETRIC ADMISSION HISTORY AND PHYSICAL  Christina Willis is a 26 y.o. female [phone] with IUP at [redacted]w[redacted]d by 28 week [place] presenting for eIOL. She initially presented for ECV, however, fetus found to be vertex on bedside US. She reports +FMs, no LOF, no VB, no blurry vision, headaches, peripheral edema, or RUQ pain.  She plans on formula feeding. She requests OCPs for birth control postpartum.  She received her prenatal care at [facility].   Dating: By [place] --->  Estimated Date of Delivery: 03/15/22  Sono:   @[redacted]w[redacted]d , normal anatomy, breech presentation, anterior placental lie, 1245 g, 26% EFW  Prenatal History/Complications:  Insufficient prenatal care Lewis isoimmunization  Hx alcohol/Percocet use  Alpha thalassemia silent carrier Maternal obesity (BMI 35) Rh negative   Past Medical History: Past Medical History:  Diagnosis Date   Alpha thalassemia silent carrier 06/14/2019   Aa/a- [ ]  FOB testing and Genetic counseling recommended   Asthma    GBS (group B Streptococcus carrier), +RV culture, currently pregnant 09/21/2020    Past Surgical History: Past Surgical History:  Procedure Laterality Date   NO PAST SURGERIES      Obstetrical History: OB History     Gravida  4   Para  3   Term  3   Preterm      AB      Living  3      SAB      IAB      Ectopic      Multiple  0   Live Births  3           Social History Social History   Socioeconomic History   Marital status: Single    Spouse name: Not on file   Number of children: Not on file   Years of education: Not on file   Highest education level: Not on file  Occupational History   Not on file  Tobacco Use   Smoking status: Never   Smokeless tobacco: Never  Vaping Use   Vaping Use: Never used  Substance and Sexual Activity   Alcohol use: No   Drug use: No   Sexual activity: Yes    Partners: Male    Birth control/protection: None  Other Topics Concern   Not on file  Social History  Narrative   Not on file   Social Determinants of Health   Financial Resource Strain: Not on file  Food Insecurity: Not on file  Transportation Needs: Not on file  Physical Activity: Not on file  Stress: Not on file  Social Connections: Not on file    Family History: Family History  Problem Relation Age of Onset   Cancer Neg Hx    Diabetes Neg Hx    Hypertension Neg Hx     Allergies: Allergies  Allergen Reactions   Apple Juice Itching    Raw apples   Banana Itching   Peanut-Containing Drug Products Swelling    Raw peanuts   Black Walnut Flavor Itching    Raw walnuts    Medications Prior to Admission  Medication Sig Dispense Refill Last Dose   albuterol (VENTOLIN HFA) 108 (90 Base) MCG/ACT inhaler Inhale 2 puffs into the lungs every 4 (four) hours as needed for wheezing or shortness of breath (asthma). Reported on 04/04/2016 (Patient not taking: Reported on 12/27/2021)      cefadroxil (DURICEF) 500 MG capsule Take 1 capsule (500 mg total) by mouth 2 (two) times daily. 14 capsule 0  cyclobenzaprine (FLEXERIL) 10 MG tablet Take 1 tablet (10 mg total) by mouth 3 (three) times daily as needed for muscle spasms. 90 tablet 0    fluconazole (DIFLUCAN) 150 MG tablet Take 1 tablet (150 mg total) by mouth daily. 1 tablet 1    hydrOXYzine (VISTARIL) 25 MG capsule Take 1 capsule (25 mg total) by mouth 3 (three) times daily as needed. 30 capsule 3    naloxone (NARCAN) nasal spray 4 mg/0.1 mL Use as needed to reverse opioid overdose 2 each 1    pantoprazole (PROTONIX) 20 MG tablet Take 1 tablet (20 mg total) by mouth daily. 30 tablet 3    Prenatal Vit-Fe Fumarate-FA (MULTIVITAMIN-PRENATAL) 27-0.8 MG TABS tablet Take 1 tablet by mouth daily at 12 noon. (Patient not taking: Reported on 12/27/2021) 30 tablet 0    terconazole (TERAZOL 3) 0.8 % vaginal cream Place 1 applicator vaginally at bedtime. Apply nightly for three nights. 20 g 0      Review of Systems  All systems reviewed and  negative except as stated in HPI  Blood pressure 134/85, pulse 98, temperature 98.1 F (36.7 C), temperature source Oral, resp. rate 20, height 5\' 5"  (1.651 m), weight 97.5 kg, last menstrual period 12/14/2020, not currently breastfeeding.  General appearance: alert, cooperative, and no distress Lungs: normal work of breathing on room air  Heart: normal rate, warm and well perfused  Abdomen: soft, non-tender, gravid  Extremities: no LE edema or calf tenderness to palpation   Presentation: Cephalic by BSUS Fetal monitoring: Baseline 130 bpm, moderate variability, + accels, no decels  Uterine activity: Irregular contractions  Dilation: 3 Effacement (%): 50 Station: -1 Exam by:: [email or web address] RN  Prenatal labs: ABO, Rh: --/--/PENDING (06/19 0840) Antibody: PENDING (06/19 0840) Rubella: 4.74 (03/02 1200) RPR: NON REACTIVE (06/16 0610)  HBsAg: NON REACTIVE (03/02 1200)  HIV: Non Reactive (03/02 1200)  GBS: Negative/-- (06/10 0000)  2 hr Glucola - Not done  Genetic screening - Not done  Anatomy 02-18-1968 normal   Prenatal Transfer Tool  Maternal Diabetes: Unknown  Genetic Screening: Not done  Maternal Ultrasounds/Referrals: Normal Fetal Ultrasounds or other Referrals:  None Maternal Substance Abuse:  Hx alcohol use and Percocet use; denies current use. Reports that she was taking the Percocet for pain.  Significant Maternal Medications:  None Significant Maternal Lab Results: Group B Strep negative  Results for orders placed or performed during the hospital encounter of 03/11/22 (from the past 24 hour(s))  CBC   Collection Time: 03/11/22  8:40 AM  Result Value Ref Range   WBC 7.2 4.0 - 10.5 K/uL   RBC 3.88 3.87 - 5.11 MIL/uL   Hemoglobin 10.5 (L) 12.0 - 15.0 g/dL   HCT 03/13/22 (L) 11.6 - 57.9 %   MCV 87.1 80.0 - 100.0 fL   MCH 27.1 26.0 - 34.0 pg   MCHC 31.1 30.0 - 36.0 g/dL   RDW 03.8 (H) 33.3 - 83.2 %   Platelets 202 150 - 400 K/uL   nRBC 0.0 0.0 - 0.2 %  Type and screen [facility]   Collection Time: 03/11/22  8:40 AM  Result Value Ref Range   ABO/RH(D) PENDING    Antibody Screen PENDING    Sample Expiration      03/14/2022,2359 Performed at [facility], [address] [place], [place]     Patient Active Problem List   Diagnosis Date Noted   Pregnant 03/11/2022   Encounter for elective induction  of labor 03/08/2022   Lewis isoimmunization during pregnancy 12/24/2021   Supervision of pregnancy with insufficient antenatal care 12/17/2021   Drug use complicating pregnancy 11/22/2021   Alpha thalassemia silent carrier 06/14/2019   Maternal obesity syndrome, antepartum 05/27/2019   Rh negative, antepartum 04/04/2016    Assessment/Plan:  Christina Willis is a 26 y.o. G4P3003 at [redacted]w[redacted]d here for eIOL.   #Labor: Will start induction with Pitocin 2x2 and reassess in 4 hours. Plan for AROM on next exam as able.  #Pain: PRN; planning for epidural when ready  #FWB: Cat 1 #ID:  GBS negative  #MOF: Formula  #MOC: OCPs #Circ:  Desires inpatient   #Insufficient prenatal care: Plan for SW consult postpartum.   #Rh negative: Rhogam evaluation postpartum.   #Hx of drug/alcohol use: None currently per patient. Reports not using Percocet for recreational use but was using it for pain. Denies current alcohol use. Was told previously that she could have a glass of wine occasionally and that was all she did.   Worthy Rancher, MD  03/11/2022, 10:34 AM

## 2022-03-12 LAB — CBC
HCT: 31.2 % — ABNORMAL LOW (ref 36.0–46.0)
Hemoglobin: 9.9 g/dL — ABNORMAL LOW (ref 12.0–15.0)
MCH: 27.2 pg (ref 26.0–34.0)
MCHC: 31.7 g/dL (ref 30.0–36.0)
MCV: 85.7 fL (ref 80.0–100.0)
Platelets: 187 10*3/uL (ref 150–400)
RBC: 3.64 MIL/uL — ABNORMAL LOW (ref 3.87–5.11)
RDW: 15.5 % (ref 11.5–15.5)
WBC: 9.2 10*3/uL (ref 4.0–10.5)
nRBC: 0 % (ref 0.0–0.2)

## 2022-03-12 LAB — RPR: RPR Ser Ql: NONREACTIVE

## 2022-03-12 MED ORDER — RHO D IMMUNE GLOBULIN 1500 UNIT/2ML IJ SOSY
300.0000 ug | PREFILLED_SYRINGE | Freq: Once | INTRAMUSCULAR | Status: AC
  Administered 2022-03-12: 300 ug via INTRAVENOUS
  Filled 2022-03-12: qty 2

## 2022-03-12 MED ORDER — FERROUS SULFATE 325 (65 FE) MG PO TABS
325.0000 mg | ORAL_TABLET | ORAL | Status: DC
  Administered 2022-03-12: 325 mg via ORAL
  Filled 2022-03-12: qty 1

## 2022-03-12 NOTE — Anesthesia Postprocedure Evaluation (Signed)
Anesthesia Post Note  Patient: Smith Mcnicholas  Procedure(s) Performed: AN AD HOC LABOR EPIDURAL     Patient location during evaluation: Mother Baby Anesthesia Type: Epidural Level of consciousness: awake and alert Pain management: pain level controlled Vital Signs Assessment: post-procedure vital signs reviewed and stable Respiratory status: spontaneous breathing, nonlabored ventilation and respiratory function stable Cardiovascular status: stable Postop Assessment: no headache, no backache and epidural receding Anesthetic complications: no   No notable events documented.  Last Vitals:  Vitals:   03/12/22 0200 03/12/22 0537  BP: 122/68 110/68  Pulse: 90 80  Resp: 16 16  Temp: 36.7 C 36.4 C  SpO2:      Last Pain:  Vitals:   03/12/22 0616  TempSrc:   PainSc: 0-No pain   Pain Goal:                   Shalayah Beagley
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
## 2022-03-12 NOTE — Progress Notes (Addendum)
POSTPARTUM PROGRESS NOTE  Subjective: Christina Willis is a 26 y.o. G4P4004 SVD at [redacted]w[redacted]d.  She reports she doing well. No acute events overnight. She denies any problems with ambulating, voiding or po intake. She has not passed flatus. Pain is well controlled and bleeding is improving. Lochia is improving.  Objective: Blood pressure 110/68, pulse 80, temperature 97.6 F (36.4 C), temperature source Oral, resp. rate 16, height 5\' 5"  (1.651 m), weight 97.5 kg, last menstrual period 12/14/2020, SpO2 100 %, unknown if currently breastfeeding.  Physical Exam:  General: alert, cooperative and no distress Chest: no respiratory distress Abdomen: soft, non-tender  Uterine Fundus: firm and at level of umbilicus Extremities: No calf swelling or tenderness  no edema  Recent Labs    03/11/22 0840 03/12/22 0623  HGB 10.5* 9.9*  HCT 33.8* 31.2*    Assessment/Plan: Christina Willis is a 26 y.o. 22 s/p SVD at [redacted]w[redacted]d for eIOL.  Routine Postpartum Care: Doing well, pain well-controlled.  -- Continue routine care, lactation support  -- Contraception: OCP -- Feeding: Formula  Acute Blood Loss anemia EBL 175 mL during delivery, Hgb 9.9 this morning. Patient with no complaints of anemia, but would benefit from Fe supplementation.  -Ferrous Sulfate 325 mg, every other day  Rh negative w/ Rh positive baby Mom Rh negative, baby DAT positive -Rhogam   Dispo: Plan to stay another day  [redacted]w[redacted]d, MD Faculty Practice, Center for [facility] 03/12/2022 7:40 AM  Attestation of Supervision of Student:  I confirm that I have verified the information documented in the physician assistant student's note and that I have also personally reperformed the history, physical exam and all medical decision making activities.  I have verified that all services and findings are accurately documented in this student's note; and I agree with management and plan as outlined in the documentation. I have  also made any necessary editorial changes.   03/14/2022, CNM [facility], [facility] 03/12/2022 11:00 AM

## 2022-03-13 LAB — RH IG WORKUP (INCLUDES ABO/RH)
Fetal Screen: NEGATIVE
Gestational Age(Wks): 39
Unit division: 0

## 2022-03-13 MED ORDER — FERROUS SULFATE 325 (65 FE) MG PO TABS: 325.0000 mg | ORAL_TABLET | ORAL | 3 refills | Status: DC

## 2022-03-13 MED ORDER — IBUPROFEN 600 MG PO TABS
600.0000 mg | ORAL_TABLET | Freq: Four times a day (QID) | ORAL | 0 refills | Status: DC
Start: 2022-03-13 — End: 2023-05-14

## 2022-03-15 LAB — TYPE AND SCREEN
ABO/RH(D): O NEG
Antibody Screen: POSITIVE
Donor AG Type: NEGATIVE
Donor AG Type: NEGATIVE
Unit division: 0
Unit division: 0

## 2022-03-15 LAB — BPAM RBC
Blood Product Expiration Date: 202307192359
Blood Product Expiration Date: 202307192359
Unit Type and Rh: 9500
Unit Type and Rh: 9500

## 2022-03-20 ENCOUNTER — Telehealth (HOSPITAL_COMMUNITY): Payer: Self-pay | Admitting: *Deleted

## 2022-03-20 NOTE — Telephone Encounter (Signed)
Left phone voicemail message.  Duffy Rhody, RN 03-20-2022 at 1:22pm

## 2022-04-16 ENCOUNTER — Ambulatory Visit: Payer: Medicaid Other | Admitting: Family Medicine

## 2022-07-21 ENCOUNTER — Ambulatory Visit
Admission: EM | Admit: 2022-07-21 | Discharge: 2022-07-21 | Disposition: A | Discharge status: Home / Self Care | Payer: Medicaid Other | Attending: Emergency Medicine | Admitting: Emergency Medicine

## 2022-07-21 DIAGNOSIS — F419 Anxiety disorder, unspecified: Secondary | ICD-10-CM

## 2022-07-21 DIAGNOSIS — L02419 Cutaneous abscess of limb, unspecified: Secondary | ICD-10-CM

## 2022-07-21 DIAGNOSIS — L03119 Cellulitis of unspecified part of limb: Secondary | ICD-10-CM

## 2022-07-21 MED ORDER — HYDROXYZINE HCL 25 MG PO TABS: 25.0000 mg | ORAL_TABLET | Freq: Four times a day (QID) | ORAL | 0 refills | Status: AC | PRN

## 2022-07-21 MED ORDER — SULFAMETHOXAZOLE-TRIMETHOPRIM 800-160 MG PO TABS: 1.0000 | ORAL_TABLET | Freq: Two times a day (BID) | ORAL | 0 refills | Status: AC

## 2022-07-21 NOTE — ED Provider Notes (Signed)
[facility]    CSN: [id number] Arrival date & time: 07/21/22  1344    HISTORY   Chief Complaint  Patient presents with   Insect Bite   HPI Christina Willis is a pleasant, 26 y.o. female who presents to urgent care today. Patient complains of an insect bite on her left lower leg that is causing pain and swelling.  Patient states she noticed it yesterday.  Patient states she has been having a lot of anxiety and panic attacks recently.  She has been diagnosed and treated with anxiety in the past, used to take medication for this, does not recall what it was, states it did not help.    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 Authorizing Provider  albuterol (VENTOLIN HFA) 108 (90 Base) MCG/ACT inhaler Inhale 2 puffs into the lungs every 4 (four) hours as needed for wheezing or shortness of breath (asthma). Reported on 04/04/2016 Patient not taking: Reported on 12/27/2021    [provider]  cefadroxil (DURICEF) 500 MG capsule Take 1 capsule (500 mg total) by mouth 2 (two) times daily. 03/08/22   Gabriel Carina, CNM  ferrous sulfate 325 (65 FE) MG tablet Take 1 tablet (325 mg total) by mouth every other day. 03/14/22   Laury Deep, CNM  fluconazole (DIFLUCAN) 150 MG tablet Take 1 tablet (150 mg total) by mouth daily. 03/08/22   Gabriel Carina, CNM  hydrOXYzine (VISTARIL) 25 MG capsule Take 1 capsule (25 mg total) by mouth 3 (three) times daily as needed. 11/22/21   Leftwich-Kirby, Kathie Dike, CNM  ibuprofen (ADVIL) 600 MG tablet Take 1 tablet (600 mg total) by mouth every 6 (six) hours. 03/13/22   Laury Deep, CNM  naloxone [facility]) nasal spray 4 mg/0.1 mL Use as needed to reverse opioid overdose 11/23/21   Clarnce Flock, MD  pantoprazole (PROTONIX) 20 MG tablet Take 1 tablet (20 mg total) by mouth daily. 11/22/21   Leftwich-Kirby, Kathie Dike, CNM  Prenatal Vit-Fe Fumarate-FA (MULTIVITAMIN-PRENATAL) 27-0.8 MG TABS tablet Take 1 tablet by mouth daily at 12 noon. Patient not taking: Reported on 12/27/2021 12/24/21   Chancy Milroy, MD  terconazole (TERAZOL 3) 0.8 % vaginal cream Place 1 applicator vaginally at bedtime. Apply  nightly for three nights. 03/02/22   Calvert Cantor, CNM    Family History Family History  Problem Relation Age of Onset   Cancer Neg Hx    Diabetes Neg Hx    Hypertension Neg Hx    Social History Social History   Tobacco Use   Smoking status: Never   Smokeless tobacco: Never  Vaping Use   Vaping Use: Never used  Substance Use Topics   Alcohol use: No   Drug use: No   Allergies   Apple juice, Banana, Peanut-containing drug products, and Black walnut flavor  Review of Systems Review of Systems Pertinent findings revealed after performing a 14 point review of systems has been noted in the history of present illness.  Physical Exam Triage Vital Signs ED Triage Vitals  Enc Vitals Group     BP 07/20/21 0827 (!) 147/82      Pulse Rate 07/20/21 0827 72     Resp 07/20/21 0827 18     Temp 07/20/21 0827 98.3 F (36.8 C)     Temp Source 07/20/21 0827 Oral     SpO2 07/20/21 0827 98 %     Weight --      Height --      Head Circumference --      Peak Flow --      Pain Score 07/20/21 0826 5     Pain Loc --      Pain Edu? --      Excl. in [place]? --   No data found.  Updated Vital Signs BP 133/85 (BP Location: Left Arm)   Pulse 78   Temp 97.6 F (36.4 C) (Oral)   Resp 18   LMP 07/21/2022   SpO2 98%   Physical Exam Vitals and nursing note reviewed.  Constitutional:      General: She is not in acute distress.    Appearance: Normal appearance.  HENT:     Head: Normocephalic and atraumatic.  Eyes:     Pupils: Pupils are equal, round, and reactive to light.  Cardiovascular:     Rate and Rhythm: Normal rate and regular rhythm.  Pulmonary:     Effort: Pulmonary effort is normal.     Breath sounds: Normal breath sounds.  Musculoskeletal:        General: Normal range of motion.     Cervical back: Normal range of motion and neck supple.  Skin:    General: Skin is warm and dry.     Findings: Lesion (Anterior left lower extremity, lesion concerning for insect bite with mild purulence in the middle, mild surrounding induration and erythema.) present.  Neurological:     General: No focal deficit present.     Mental Status: She is alert and oriented to person, place, and time. Mental status is at baseline.  Psychiatric:        Mood and Affect: Mood normal.        Behavior: Behavior normal.        Thought Content: Thought content normal.        Judgment: Judgment normal.     Visual Acuity Right Eye Distance:   Left Eye Distance:   Bilateral Distance:    Right Eye Near:   Left Eye Near:    Bilateral Near:     UC Couse / Diagnostics / Procedures:     Radiology No results found.  Procedures Procedures (including critical care time) EKG  Pending results:  Labs Reviewed - No data to  display  Medications Ordered in UC: Medications - No data to display  UC Diagnoses / Final Clinical Impressions(s)   I have reviewed the triage vital signs and the nursing notes.  Pertinent labs & imaging results that were available during my care of the patient were reviewed by me and considered in my medical decision making (see chart for details).    Final diagnoses:  Cellulitis and abscess of leg  Anxiety   Patient provided with a prescription for Bactrim for likely infection of wound of her left anterior lower leg.  Patient advised to follow-up with behavioral health regarding her untreated anxiety.  Patient provided with a prescription for Atarax for temporary relief while she is waiting for an appointment.  ED precautions advised.  ED Prescriptions     Medication Sig Dispense Auth. Provider   sulfamethoxazole-trimethoprim (BACTRIM DS) 800-160 MG tablet Take 1 tablet by mouth 2 (two) times daily for 5 days. 10 tablet Theadora Rama Scales, PA-C   hydrOXYzine (ATARAX) 25 MG tablet Take 1-2 tablets (25-50 mg total) by mouth every 6 (six) hours as needed for up to 14 days for anxiety. 112 tablet Theadora Rama Scales, PA-C      PDMP not reviewed this encounter.  Pending results:  Labs Reviewed - No data to display  Discharge Instructions:   Discharge Instructions      To treat you for possible infection in your left lower leg, please begin Bactrim 1 tablet twice a day for the next 5 days.  If you do not see meaningful improvement of the redness and swelling around the bite on your left lower leg, you may have been bitten by a brown recluse spider bite.  I have enclosed information about this type of bite and how to manage it at home.  For anxiety, please take 1 to 2 tablets of hydroxyzine every 6 hours as needed.  Please reach out to the [facility] at [phone] to make an appointment for follow-up of your anxiety.  Thank you for visiting  urgent care today.      Disposition Upon Discharge:  Condition: stable for discharge home  Patient presented with an acute illness with associated systemic symptoms and significant discomfort requiring urgent management. In my opinion, this is a condition that a prudent lay person (someone who possesses an average knowledge of health and medicine) may potentially expect to result in complications if not addressed urgently such as respiratory distress, impairment of bodily function or dysfunction of bodily organs.   Routine symptom specific, illness specific and/or disease specific instructions were discussed with the patient and/or caregiver at length.   As such, the patient has been evaluated and assessed, work-up was performed and treatment was provided in alignment with urgent care protocols and evidence based medicine.  Patient/parent/caregiver has been advised that the patient may require follow up for further testing and treatment if the symptoms continue in spite of treatment, as clinically indicated and appropriate.  Patient/parent/caregiver has been advised to return to the [facility] or PCP if no better; to PCP or the Emergency Department if new signs and symptoms develop, or if the current signs or symptoms continue to change or worsen for further workup, evaluation and treatment as clinically indicated and appropriate  The patient will follow up with their current PCP if and as advised. If the patient does not currently have a PCP we will assist them in obtaining one.   The patient may need specialty follow up if the  symptoms continue, in spite of conservative treatment and management, for further workup, evaluation, consultation and treatment as clinically indicated and appropriate.   Patient/parent/caregiver verbalized understanding and agreement of plan as discussed.  All questions were addressed during visit.  Please see discharge instructions below for further details of plan.  This  office note has been dictated using Teaching laboratory technician.  Unfortunately, this method of dictation can sometimes lead to typographical or grammatical errors.  I apologize for your inconvenience in advance if this occurs.  Please do not hesitate to reach out to me if clarification is needed.      Theadora Rama Scales, PA-C 07/22/22 1256

## 2022-07-21 NOTE — ED Triage Notes (Signed)
Pt presents with insect bite to left lower leg that is causing pain and swelling since yesterday.

## 2022-07-21 NOTE — Discharge Instructions (Signed)
To treat you for possible infection in your left lower leg, please begin Bactrim 1 tablet twice a day for the next 5 days.  If you do not see meaningful improvement of the redness and swelling around the bite on your left lower leg, you may have been bitten by a brown recluse spider bite.  I have enclosed information about this type of bite and how to manage it at home.  For anxiety, please take 1 to 2 tablets of hydroxyzine every 6 hours as needed.  Please reach out to the [facility] at [phone] to make an appointment for follow-up of your anxiety.  Thank you for visiting urgent care today.

## 2022-07-22 ENCOUNTER — Other Ambulatory Visit: Payer: Self-pay

## 2022-07-22 ENCOUNTER — Emergency Department (HOSPITAL_COMMUNITY)
Admission: EM | Admit: 2022-07-22 | Discharge: 2022-07-22 | Discharge status: Against Medical Advice | Payer: Medicaid Other | Attending: Medical | Admitting: Medical

## 2022-07-22 ENCOUNTER — Emergency Department (HOSPITAL_COMMUNITY): Payer: Medicaid Other

## 2022-07-22 DIAGNOSIS — M7918 Myalgia, other site: Secondary | ICD-10-CM | POA: Diagnosis not present

## 2022-07-22 DIAGNOSIS — R079 Chest pain, unspecified: Secondary | ICD-10-CM | POA: Diagnosis not present

## 2022-07-22 DIAGNOSIS — Z5321 Procedure and treatment not carried out due to patient leaving prior to being seen by health care provider: Secondary | ICD-10-CM | POA: Diagnosis not present

## 2022-07-22 DIAGNOSIS — R110 Nausea: Secondary | ICD-10-CM | POA: Insufficient documentation

## 2022-07-22 DIAGNOSIS — S80862A Insect bite (nonvenomous), left lower leg, initial encounter: Secondary | ICD-10-CM | POA: Diagnosis present

## 2022-07-22 DIAGNOSIS — W57XXXA Bitten or stung by nonvenomous insect and other nonvenomous arthropods, initial encounter: Secondary | ICD-10-CM | POA: Insufficient documentation

## 2022-07-22 LAB — CBC WITH DIFFERENTIAL/PLATELET
Abs Immature Granulocytes: 0.01 10*3/uL (ref 0.00–0.07)
Basophils Absolute: 0 10*3/uL (ref 0.0–0.1)
Basophils Relative: 0 %
Eosinophils Absolute: 0.2 10*3/uL (ref 0.0–0.5)
Eosinophils Relative: 3 %
HCT: 38.2 % (ref 36.0–46.0)
Hemoglobin: 11.9 g/dL — ABNORMAL LOW (ref 12.0–15.0)
Immature Granulocytes: 0 %
Lymphocytes Relative: 22 %
Lymphs Abs: 1.2 10*3/uL (ref 0.7–4.0)
MCH: 27.9 pg (ref 26.0–34.0)
MCHC: 31.2 g/dL (ref 30.0–36.0)
MCV: 89.7 fL (ref 80.0–100.0)
Monocytes Absolute: 0.4 10*3/uL (ref 0.1–1.0)
Monocytes Relative: 8 %
Neutro Abs: 3.6 10*3/uL (ref 1.7–7.7)
Neutrophils Relative %: 67 %
Platelets: 351 10*3/uL (ref 150–400)
RBC: 4.26 MIL/uL (ref 3.87–5.11)
RDW: 14.1 % (ref 11.5–15.5)
WBC: 5.4 10*3/uL (ref 4.0–10.5)
nRBC: 0 % (ref 0.0–0.2)

## 2022-07-22 LAB — BASIC METABOLIC PANEL WITH GFR
Anion gap: 6 (ref 5–15)
BUN: 11 mg/dL (ref 6–20)
CO2: 25 mmol/L (ref 22–32)
Calcium: 9.1 mg/dL (ref 8.9–10.3)
Chloride: 108 mmol/L (ref 98–111)
Creatinine, Ser: 0.93 mg/dL (ref 0.44–1.00)
GFR, Estimated: >60 mL/min (ref >60)
Glucose, Bld: 87 mg/dL (ref 70–99)
Potassium: 3.9 mmol/L (ref 3.5–5.1)
Sodium: 139 mmol/L (ref 135–145)

## 2022-07-22 LAB — I-STAT BETA HCG BLOOD, ED (MC, WL, AP ONLY): I-stat hCG, quantitative: <5 m[IU]/mL (ref <5)

## 2022-07-22 LAB — TROPONIN I (HIGH SENSITIVITY): Troponin I (High Sensitivity): <2 ng/L (ref <18)

## 2022-07-22 NOTE — ED Provider Triage Note (Signed)
Emergency Medicine Provider Triage Evaluation Note  Christina Willis , a 26 y.o. female  was evaluated in triage.  Pt complains of chest pain starting this AM w/o associated sob. States she is not sure if this is just her anxiety.  Also here for a insect/spider bite on her leg for the last few days. Was seen at [facility] today and prescribed Bactrim. Threw up first dose of bactrim. Endorses nausea prior to abx.   Review of Systems  Positive: Nausea, chest pain Negative: Fever, chills  Physical Exam  BP (!) 150/93   Pulse 99   Temp 98 F (36.7 C)   Resp 20   LMP 07/21/2022   SpO2 99%  Gen:   Awake, no distress   Resp:  Normal effort  MSK:   Moves extremities without difficulty  Other: Erythematous patch on LLE without fluctuance.   Medical Decision Making  Medically screening exam initiated at 6:23 PM.  Appropriate orders placed.  Christina Willis was informed that the remainder of the evaluation will be completed by another provider, this initial triage assessment does not replace that evaluation, and the importance of remaining in the ED until their evaluation is complete.   Osvaldo Shipper, [place] 07/22/22 [phone]

## 2022-07-22 NOTE — ED Triage Notes (Signed)
Pt reports insect bite to left lower leg 2 days ago with redness, swelling, and tingling. Pt reports occasionally yellow drainage and body aches.

## 2022-09-23 NOTE — L&D Delivery Note (Addendum)
OB/GYN Faculty Practice Delivery Note  Christina Willis is a 27 y.o. [id number] s/p SVD at [redacted]w[redacted]d. She was admitted for PROM in s/o IUGR.   ROM: 30h 29m with thin meconium fluid GBS Status:  POSITIVE/-- (11/10 0026) Maximum Maternal Temperature: 98.69F  Labor Progress: Initial SVE: 3/Thick/-2. She then progressed to complete.   Delivery Date/Time: 0920 08/03/23 Delivery: Called to room as nurse was stating that patient feeling pressure and had a fore-bag.  AROM fore-bag and made quick change to 8/80/0.  Patient not feeling pressure so stepped out the room.  Followed by nurse who stated patient now feeling pressure so went back to the room and patient was complete and pushing. Head delivered DOA. No nuchal cord present. Shoulder and body delivered in usual fashion. Infant with spontaneous cry, placed on mother's abdomen, dried and stimulated. Cord clamped x 2 after 1-minute delay, and cut by myself. Cord blood drawn. Placenta delivered spontaneously with gentle cord traction. Fundus firm with massage and Pitocin. Labia, perineum, vagina, and cervix inspected without laceration. Christina Willis and baby doing well.  Pt states she would still like PP BTL so agreeable to remain NPO at this time.   Baby Weight: pending  Placenta: 3 vessel, intact. Sent to L&D Complications: None Lacerations: none EBL: 121 mL Analgesia: Epidural   Infant:  APGAR (1 MIN):  9 APGAR (5 MINS):  9  Hessie Dibble, MD [facility] Family Medicine Fellow, [place] for [facility], [facility] 08/03/2023, 9:33 AM

## 2022-12-05 ENCOUNTER — Encounter (HOSPITAL_COMMUNITY): Payer: Self-pay

## 2022-12-05 ENCOUNTER — Ambulatory Visit (HOSPITAL_COMMUNITY): Payer: Self-pay

## 2022-12-05 ENCOUNTER — Ambulatory Visit (HOSPITAL_COMMUNITY)
Admission: EM | Admit: 2022-12-05 | Discharge: 2022-12-05 | Disposition: A | Discharge status: Home / Self Care | Payer: Medicaid Other | Attending: Family Medicine | Admitting: Family Medicine

## 2022-12-05 DIAGNOSIS — F411 Generalized anxiety disorder: Secondary | ICD-10-CM | POA: Diagnosis present

## 2022-12-05 DIAGNOSIS — N390 Urinary tract infection, site not specified: Secondary | ICD-10-CM | POA: Diagnosis present

## 2022-12-05 DIAGNOSIS — Z3201 Encounter for pregnancy test, result positive: Secondary | ICD-10-CM | POA: Diagnosis not present

## 2022-12-05 LAB — POCT URINALYSIS DIPSTICK, ED / UC
Bilirubin Urine: NEGATIVE
Glucose, UA: NEGATIVE mg/dL
Hgb urine dipstick: NEGATIVE
Ketones, ur: NEGATIVE mg/dL
Nitrite: NEGATIVE
Protein, ur: NEGATIVE mg/dL
Specific Gravity, Urine: 1.025 (ref 1.005–1.030)
Urobilinogen, UA: 0.2 mg/dL (ref 0.0–1.0)
pH: 5.5 (ref 5.0–8.0)

## 2022-12-05 LAB — POC URINE PREG, ED: Preg Test, Ur: POSITIVE — AB

## 2022-12-05 MED ORDER — CEPHALEXIN 500 MG PO CAPS
500.0000 mg | ORAL_CAPSULE | Freq: Three times a day (TID) | ORAL | 0 refills | Status: AC
Start: 2022-12-05 — End: 2022-12-12

## 2022-12-05 MED ORDER — HYDROXYZINE HCL 25 MG PO TABS: 25.0000 mg | ORAL_TABLET | Freq: Four times a day (QID) | ORAL | 0 refills | Status: DC

## 2022-12-05 NOTE — ED Provider Notes (Signed)
[place]    CSN: [id number] Arrival date & time: 12/05/22  1220      History   Chief Complaint Chief Complaint  Patient presents with   Dysuria   Anxiety    HPI Christina Willis is a 27 y.o. female.   Patient is here for burning with urination, foul odor.  Some urinary hesitancy.  No frequency.  No belly pain/back pain.  No vaginal itching or discharge.  No risks for stds.  Her lmp was 2/12;  took a test at home and was a faint line.   She also has h/o anxiety.  She is having issues with being shaky, can't sleep, sweating, sob.  She was on hydrozyzine, but has been out of that the last month or so.  She thinks she does have a pcp but has not been to one in a long time.        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 Authorizing Provider  ferrous sulfate 325 (65 FE) MG tablet Take 1 tablet (325 mg total) by mouth every other day. 03/14/22   Laury Deep, CNM   ibuprofen (ADVIL) 600 MG tablet Take 1 tablet (600 mg total) by mouth every 6 (six) hours. 03/13/22   Laury Deep, CNM  naloxone [facility]) nasal spray 4 mg/0.1 mL Use as needed to reverse opioid overdose 11/23/21   Clarnce Flock, MD  pantoprazole (PROTONIX) 20 MG tablet Take 1 tablet (20 mg total) by mouth daily. 11/22/21   Leftwich-Kirby, Kathie Dike, CNM    Family History Family History  Problem Relation Age of Onset   Cancer Neg Hx    Diabetes Neg Hx    Hypertension Neg Hx     Social History Social History   Tobacco Use   Smoking status: Never   Smokeless tobacco: Never  Vaping Use   Vaping Use: Never used  Substance Use Topics   Alcohol use: No   Drug use: No     Allergies   Apple juice, Banana, Peanut-containing drug products, Black walnut flavor, and Other   Review of Systems Review of Systems  Constitutional: Negative.   HENT: Negative.    Respiratory: Negative.    Cardiovascular: Negative.  Gastrointestinal: Negative.   Genitourinary:  Positive for dysuria.  Musculoskeletal: Negative.   Psychiatric/Behavioral:  The patient is nervous/anxious.      Physical Exam Triage Vital Signs ED Triage Vitals  Enc Vitals Group     BP 12/05/22 1236 117/76     Pulse Rate 12/05/22 1236 84     Resp 12/05/22 1236 (!) 25     Temp 12/05/22 1236 97.6 F (36.4 C)     Temp Source 12/05/22 1236 Oral     SpO2 12/05/22 1236 96 %     Weight --      Height --      Head Circumference --      Peak Flow --      Pain Score 12/05/22 1237 0     Pain Loc --      Pain Edu? --      Excl. in [place]? --    No data found.  Updated Vital Signs BP 117/76 (BP Location: Left Arm)   Pulse 84   Temp 97.6 F (36.4 C) (Oral)   Resp (!) 25   LMP 11/04/2022   SpO2 96%   Visual Acuity Right Eye Distance:   Left Eye Distance:   Bilateral Distance:    Right Eye Near:   Left Eye Near:    Bilateral Near:     Physical Exam Constitutional:      Appearance: Normal appearance.  HENT:      Head: Normocephalic.  Cardiovascular:     Rate and Rhythm: Normal rate and regular rhythm.  Pulmonary:     Effort: Pulmonary effort is normal.     Breath sounds: Normal breath sounds.  Abdominal:     Palpations: Abdomen is soft.     Tenderness: There is no guarding or rebound.  Musculoskeletal:     Cervical back: Normal range of motion and neck supple.  Skin:    General: Skin is warm.  Neurological:     General: No focal deficit present.     Mental Status: She is alert.  Psychiatric:        Mood and Affect: Mood normal.      UC Treatments / Results  Labs (all labs ordered are listed, but only abnormal results are displayed) Labs Reviewed  POCT URINALYSIS DIPSTICK, ED / UC - Abnormal; Notable for the following components:      Result Value   Leukocytes,Ua SMALL (*)    All other components within normal limits  POC URINE PREG, ED - Abnormal; Notable for the following components:   Preg Test, Ur POSITIVE (*)    All other components within normal limits  URINE CULTURE    EKG   Radiology No results found.  Procedures Procedures (including critical care time)  Medications Ordered in UC Medications - No data to display  Initial Impression / Assessment and Plan / UC Course  I have reviewed the triage vital signs and the nursing notes.  Pertinent labs & imaging results that were available during my care of the patient were reviewed by me and considered in my medical decision making (see chart for details).   Final Clinical Impressions(s) / UC Diagnoses   Final diagnoses:  Lower urinary tract infectious disease  Positive pregnancy test  Anxiety state     Discharge Instructions      You were seen today for urinary symptoms.  I am treating you with an antibiotic three times/day x 7 days.  Your pregnancy test was positive today as well.  Please avoid alcohol and tobacco products.  You may use tylenol for pain if needed.  Please follow up with an ob/gyn.  I  have sent out hydroxyzine for your anxiety in the mean time.  Discuss this further with your doctor.     ED Prescriptions     Medication Sig Dispense Auth. Provider   hydrOXYzine (ATARAX) 25 MG tablet Take 1 tablet (25 mg total) by mouth every 6 (six) hours. 12 tablet Olie Dibert, MD   cephALEXin (KEFLEX) 500 MG capsule Take 1 capsule (500 mg total) by mouth 3 (three) times daily for 7 days. 21 capsule Rondel Oh, MD      PDMP not reviewed this encounter.   Rondel Oh, MD 12/05/22 1316

## 2022-12-05 NOTE — ED Triage Notes (Addendum)
Patient states that she has been having dysuria and a foul odor to her urine x 1 week.  Patient also c/o anxiety and states she was on a medication, hydroxyzine 25 mg when neededed but has not had in 2 months. Patient states her anxiety has worsened in the past 2 months and is now having heart pounding, sweating, and shaking at times.

## 2022-12-05 NOTE — Discharge Instructions (Signed)
You were seen today for urinary symptoms.  I am treating you with an antibiotic three times/day x 7 days.  Your pregnancy test was positive today as well.  Please avoid alcohol and tobacco products.  You may use tylenol for pain if needed.  Please follow up with an ob/gyn.  I have sent out hydroxyzine for your anxiety in the mean time.  Discuss this further with your doctor.

## 2022-12-07 LAB — URINE CULTURE: Culture: 80000 — AB

## 2023-04-07 ENCOUNTER — Encounter: Payer: Medicaid Other | Admitting: Student

## 2023-04-15 ENCOUNTER — Encounter: Payer: Medicaid Other | Admitting: Obstetrics and Gynecology

## 2023-05-01 ENCOUNTER — Encounter: Payer: Medicaid Other | Admitting: Family Medicine

## 2023-05-13 DIAGNOSIS — Z3480 Encounter for supervision of other normal pregnancy, unspecified trimester: Secondary | ICD-10-CM

## 2023-05-13 HISTORY — DX: Encounter for supervision of other normal pregnancy, unspecified trimester: Z34.80

## 2023-05-14 ENCOUNTER — Other Ambulatory Visit (HOSPITAL_COMMUNITY)
Admission: RE | Admit: 2023-05-14 | Discharge: 2023-05-14 | Disposition: A | Discharge status: Home / Self Care | Payer: Medicaid Other | Source: Ambulatory Visit | Attending: Family Medicine | Admitting: Family Medicine

## 2023-05-14 ENCOUNTER — Encounter: Payer: Self-pay | Admitting: Obstetrics and Gynecology

## 2023-05-14 ENCOUNTER — Ambulatory Visit (INDEPENDENT_AMBULATORY_CARE_PROVIDER_SITE_OTHER): Payer: Medicaid Other | Admitting: Obstetrics and Gynecology

## 2023-05-14 VITALS — BP 140/86 | HR 128 | Temp 99.1°F | Wt 180.0 lb

## 2023-05-14 DIAGNOSIS — Z3009 Encounter for other general counseling and advice on contraception: Secondary | ICD-10-CM | POA: Diagnosis not present

## 2023-05-14 DIAGNOSIS — O09522 Supervision of elderly multigravida, second trimester: Secondary | ICD-10-CM

## 2023-05-14 DIAGNOSIS — Z1339 Encounter for screening examination for other mental health and behavioral disorders: Secondary | ICD-10-CM

## 2023-05-14 DIAGNOSIS — Z3480 Encounter for supervision of other normal pregnancy, unspecified trimester: Secondary | ICD-10-CM

## 2023-05-14 DIAGNOSIS — Z6791 Unspecified blood type, Rh negative: Secondary | ICD-10-CM

## 2023-05-14 DIAGNOSIS — O26892 Other specified pregnancy related conditions, second trimester: Secondary | ICD-10-CM | POA: Diagnosis not present

## 2023-05-14 DIAGNOSIS — D563 Thalassemia minor: Secondary | ICD-10-CM | POA: Diagnosis not present

## 2023-05-14 DIAGNOSIS — O2302 Infections of kidney in pregnancy, second trimester: Secondary | ICD-10-CM | POA: Diagnosis not present

## 2023-05-14 DIAGNOSIS — O2300 Infections of kidney in pregnancy, unspecified trimester: Secondary | ICD-10-CM

## 2023-05-14 DIAGNOSIS — Z3A27 27 weeks gestation of pregnancy: Secondary | ICD-10-CM | POA: Diagnosis not present

## 2023-05-14 DIAGNOSIS — O26899 Other specified pregnancy related conditions, unspecified trimester: Secondary | ICD-10-CM

## 2023-05-14 HISTORY — DX: Infections of kidney in pregnancy, unspecified trimester: O23.00

## 2023-05-14 MED ORDER — PREPLUS 27-1 MG PO TABS: 1.0000 | ORAL_TABLET | Freq: Every day | ORAL | 13 refills | Status: AC

## 2023-05-14 MED ORDER — CEFTRIAXONE SODIUM 500 MG IJ SOLR
500.0000 mg | Freq: Once | INTRAMUSCULAR | Status: AC
  Administered 2023-05-14: 500 mg via INTRAMUSCULAR

## 2023-05-14 MED ORDER — CEPHALEXIN 500 MG PO CAPS: 500.0000 mg | ORAL_CAPSULE | Freq: Three times a day (TID) | ORAL | 0 refills | Status: AC

## 2023-05-14 NOTE — Progress Notes (Signed)
Subjective:  Christina Willis is a 27 y.o. [id number] at [redacted]w[redacted]d being seen today for first OB visit. EDD by LMP.   She is currently monitored for the following issues for this low-risk pregnancy and has Rh negative, antepartum; Maternal obesity syndrome, antepartum; Alpha thalassemia silent carrier; Supervision of other normal pregnancy, antepartum; Unwanted fertility; and Pyelonephritis affecting pregnancy on their problem list.  Patient reports  low grade fever, chills and UTI Sx for the last several days. H/O pyelonephritis  .  Contractions: Not present. Vag. Bleeding: None.  Movement: Present. Denies leaking of fluid.   The following portions of the patient's history were reviewed and updated as appropriate: allergies, current medications, past family history, past medical history, past social history, past surgical history and problem list. Problem list updated.  Objective:   Vitals:   05/14/23 1500 05/14/23 1505  BP: (!) 151/86 (!) 140/86  Pulse: (!) 125 (!) 128  Temp: 99.1 F (37.3 C) 99.1 F (37.3 C)  Weight: 180 lb (81.6 kg) 180 lb (81.6 kg)    Fetal Status: Fetal Heart Rate (bpm): 148   Movement: Present     General:  Alert, oriented and cooperative. Patient is in no acute distress.  Skin: Skin is warm and dry. No rash noted.   Cardiovascular: Normal heart rate noted  Respiratory: Normal respiratory effort, no problems with respiration noted  Abdomen: Soft, gravid, appropriate for gestational age. Pain/Pressure: Present     Pelvic:  Cervical exam performed      + bladder tenderness  Extremities: Normal range of motion.  Edema: None  Mental Status: Normal mood and affect. Normal behavior. Normal judgment and thought content.  Bilateral CVA tenderness Urinalysis:      Assessment and Plan:  Pregnancy: G5P4004 at [redacted]w[redacted]d  1. Supervision of other normal pregnancy, antepartum Prenatal care and labs reviewed with pt Genetic test discussed Anatomy scan ordered  2. Rh negative,  antepartum Rhogam as indicated  3. Alpha thalassemia silent carrier Stable  4. Unwanted fertility BTL papers today  5. Pyelonephritis affecting pregnancy in second trimester Declined hospital admission Will give IM Rocephin today, Start Keflex 500 mg po tid x 10 days  F/U on Friday  Preterm labor symptoms and general obstetric precautions including but not limited to vaginal bleeding, contractions, leaking of fluid and fetal movement were reviewed in detail with the patient. Please refer to After Visit Summary for other counseling recommendations.  Return in about 3 weeks (around 06/04/2023) for OB visit, face to face, any provider.   Hermina Staggers, MD

## 2023-05-14 NOTE — Progress Notes (Addendum)
NOB, c/o HA 5/10 x 5 days, pelvic pain 10/10 x 3 days, constantly cold.  Administrations This Visit     cefTRIAXone (ROCEPHIN) injection 500 mg     Admin Date 05/14/2023 Action Given Dose 500 mg Route Intramuscular Documented By Maretta Bees, RMA

## 2023-05-15 ENCOUNTER — Other Ambulatory Visit: Payer: Self-pay | Admitting: *Deleted

## 2023-05-15 ENCOUNTER — Encounter: Payer: Self-pay | Admitting: *Deleted

## 2023-05-15 ENCOUNTER — Ambulatory Visit: Payer: Medicaid Other | Attending: Obstetrics and Gynecology

## 2023-05-15 ENCOUNTER — Ambulatory Visit: Payer: Medicaid Other

## 2023-05-15 ENCOUNTER — Other Ambulatory Visit: Payer: Self-pay | Admitting: Obstetrics and Gynecology

## 2023-05-15 VITALS — BP 113/71 | HR 95

## 2023-05-15 DIAGNOSIS — Z3A27 27 weeks gestation of pregnancy: Secondary | ICD-10-CM | POA: Diagnosis not present

## 2023-05-15 DIAGNOSIS — Z3689 Encounter for other specified antenatal screening: Secondary | ICD-10-CM | POA: Insufficient documentation

## 2023-05-15 DIAGNOSIS — Z363 Encounter for antenatal screening for malformations: Secondary | ICD-10-CM | POA: Diagnosis not present

## 2023-05-15 DIAGNOSIS — D563 Thalassemia minor: Secondary | ICD-10-CM | POA: Diagnosis not present

## 2023-05-15 DIAGNOSIS — Z3480 Encounter for supervision of other normal pregnancy, unspecified trimester: Secondary | ICD-10-CM

## 2023-05-15 DIAGNOSIS — O285 Abnormal chromosomal and genetic finding on antenatal screening of mother: Secondary | ICD-10-CM

## 2023-05-15 DIAGNOSIS — O99212 Obesity complicating pregnancy, second trimester: Secondary | ICD-10-CM | POA: Diagnosis not present

## 2023-05-15 DIAGNOSIS — O365920 Maternal care for other known or suspected poor fetal growth, second trimester, not applicable or unspecified: Secondary | ICD-10-CM

## 2023-05-15 DIAGNOSIS — O360120 Maternal care for anti-D [Rh] antibodies, second trimester, not applicable or unspecified: Secondary | ICD-10-CM

## 2023-05-15 DIAGNOSIS — O0932 Supervision of pregnancy with insufficient antenatal care, second trimester: Secondary | ICD-10-CM

## 2023-05-15 DIAGNOSIS — E669 Obesity, unspecified: Secondary | ICD-10-CM

## 2023-05-15 DIAGNOSIS — O365990 Maternal care for other known or suspected poor fetal growth, unspecified trimester, not applicable or unspecified: Secondary | ICD-10-CM

## 2023-05-15 LAB — CERVICOVAGINAL ANCILLARY ONLY
Bacterial Vaginitis (gardnerella): NEGATIVE
Candida Glabrata: NEGATIVE
Candida Vaginitis: NEGATIVE
Chlamydia: NEGATIVE
Comment: NEGATIVE
Comment: NEGATIVE
Comment: NEGATIVE
Comment: NEGATIVE
Comment: NEGATIVE
Comment: NORMAL
Neisseria Gonorrhea: NEGATIVE
Trichomonas: NEGATIVE

## 2023-05-15 LAB — PROTEIN / CREATININE RATIO, URINE
Creatinine, Urine: 182.7 mg/dL
Protein, Ur: 79.3 mg/dL
Protein/Creat Ratio: 434 mg/g{creat} — ABNORMAL HIGH (ref 0–200)

## 2023-05-15 LAB — TSH: TSH: 0.417 u[IU]/mL — ABNORMAL LOW (ref 0.450–4.500)

## 2023-05-16 ENCOUNTER — Encounter: Payer: Medicaid Other | Admitting: Obstetrics and Gynecology

## 2023-05-18 LAB — URINE CULTURE, OB REFLEX

## 2023-05-18 LAB — CULTURE, OB URINE

## 2023-05-19 ENCOUNTER — Encounter: Payer: Self-pay | Admitting: Obstetrics and Gynecology

## 2023-05-19 DIAGNOSIS — O365990 Maternal care for other known or suspected poor fetal growth, unspecified trimester, not applicable or unspecified: Secondary | ICD-10-CM | POA: Insufficient documentation

## 2023-05-19 LAB — CYTOLOGY - PAP
Comment: NEGATIVE
Diagnosis: UNDETERMINED — AB
High risk HPV: NEGATIVE

## 2023-05-21 LAB — COMPREHENSIVE METABOLIC PANEL
ALT: 20 IU/L (ref 0–32)
AST: 20 IU/L (ref 0–40)
Albumin: 3.5 g/dL — ABNORMAL LOW (ref 4.0–5.0)
Alkaline Phosphatase: 87 IU/L (ref 44–121)
BUN/Creatinine Ratio: 12 (ref 9–23)
BUN: 8 mg/dL (ref 6–20)
Bilirubin Total: 0.2 mg/dL (ref 0.0–1.2)
CO2: 20 mmol/L (ref 20–29)
Calcium: 8.2 mg/dL — ABNORMAL LOW (ref 8.7–10.2)
Chloride: 100 mmol/L (ref 96–106)
Creatinine, Ser: 0.65 mg/dL (ref 0.57–1.00)
Globulin, Total: 2.8 g/dL (ref 1.5–4.5)
Glucose: 81 mg/dL (ref 70–99)
Potassium: 4.5 mmol/L (ref 3.5–5.2)
Sodium: 134 mmol/L (ref 134–144)
Total Protein: 6.3 g/dL (ref 6.0–8.5)
eGFR: 124 mL/min/{1.73_m2} (ref >59)

## 2023-05-21 LAB — AB SCR+ANTIBODY ID: Antibody Screen: POSITIVE — AB

## 2023-05-21 LAB — HEMOGLOBIN A1C
Est. average glucose Bld gHb Est-mCnc: 108 mg/dL
Hgb A1c MFr Bld: 5.4 % (ref 4.8–5.6)

## 2023-05-21 LAB — CBC/D/PLT+RPR+RH+ABO+RUBIGG...
Basophils Absolute: 0 10*3/uL (ref 0.0–0.2)
Basos: 0 %
EOS (ABSOLUTE): 0 10*3/uL (ref 0.0–0.4)
Eos: 0 %
HCV Ab: NONREACTIVE
HIV Screen 4th Generation wRfx: NONREACTIVE
Hematocrit: 32.7 % — ABNORMAL LOW (ref 34.0–46.6)
Hemoglobin: 11.1 g/dL (ref 11.1–15.9)
Hepatitis B Surface Ag: NEGATIVE
Immature Grans (Abs): 0.1 10*3/uL (ref 0.0–0.1)
Immature Granulocytes: 1 %
Lymphocytes Absolute: 0.8 10*3/uL (ref 0.7–3.1)
Lymphs: 6 %
MCH: 27.5 pg (ref 26.6–33.0)
MCHC: 33.9 g/dL (ref 31.5–35.7)
MCV: 81 fL (ref 79–97)
Monocytes Absolute: 0.4 10*3/uL (ref 0.1–0.9)
Monocytes: 3 %
Neutrophils Absolute: 13.4 10*3/uL — ABNORMAL HIGH (ref 1.4–7.0)
Neutrophils: 90 %
Platelets: 272 10*3/uL (ref 150–450)
RBC: 4.03 x10E6/uL (ref 3.77–5.28)
RDW: 12.5 % (ref 11.7–15.4)
RPR Ser Ql: NONREACTIVE
Rh Factor: NEGATIVE
Rubella Antibodies, IGG: 7.44 {index} (ref >0.99)
WBC: 14.7 10*3/uL — ABNORMAL HIGH (ref 3.4–10.8)

## 2023-05-21 LAB — HCV INTERPRETATION

## 2023-05-22 ENCOUNTER — Other Ambulatory Visit: Payer: Medicaid Other

## 2023-05-22 LAB — PANORAMA PRENATAL TEST FULL PANEL:PANORAMA TEST PLUS 5 ADDITIONAL MICRODELETIONS: FETAL FRACTION: 14.3

## 2023-06-04 ENCOUNTER — Encounter: Payer: Self-pay | Admitting: *Deleted

## 2023-06-06 ENCOUNTER — Ambulatory Visit: Payer: Medicaid Other | Attending: Obstetrics and Gynecology

## 2023-06-06 ENCOUNTER — Ambulatory Visit: Payer: Medicaid Other

## 2023-06-06 DIAGNOSIS — Z3A30 30 weeks gestation of pregnancy: Secondary | ICD-10-CM

## 2023-06-06 DIAGNOSIS — O365990 Maternal care for other known or suspected poor fetal growth, unspecified trimester, not applicable or unspecified: Secondary | ICD-10-CM | POA: Diagnosis present

## 2023-06-06 DIAGNOSIS — D279 Benign neoplasm of unspecified ovary: Secondary | ICD-10-CM

## 2023-06-06 DIAGNOSIS — O365930 Maternal care for other known or suspected poor fetal growth, third trimester, not applicable or unspecified: Secondary | ICD-10-CM

## 2023-06-06 DIAGNOSIS — O3483 Maternal care for other abnormalities of pelvic organs, third trimester: Secondary | ICD-10-CM

## 2023-06-06 DIAGNOSIS — E669 Obesity, unspecified: Secondary | ICD-10-CM

## 2023-06-06 DIAGNOSIS — O0933 Supervision of pregnancy with insufficient antenatal care, third trimester: Secondary | ICD-10-CM

## 2023-06-06 DIAGNOSIS — O99013 Anemia complicating pregnancy, third trimester: Secondary | ICD-10-CM

## 2023-06-06 DIAGNOSIS — O360130 Maternal care for anti-D [Rh] antibodies, third trimester, not applicable or unspecified: Secondary | ICD-10-CM

## 2023-06-06 DIAGNOSIS — D563 Thalassemia minor: Secondary | ICD-10-CM

## 2023-06-06 DIAGNOSIS — O99213 Obesity complicating pregnancy, third trimester: Secondary | ICD-10-CM

## 2023-06-11 ENCOUNTER — Other Ambulatory Visit: Payer: Self-pay | Admitting: *Deleted

## 2023-06-11 DIAGNOSIS — O365990 Maternal care for other known or suspected poor fetal growth, unspecified trimester, not applicable or unspecified: Secondary | ICD-10-CM

## 2023-06-12 ENCOUNTER — Ambulatory Visit (INDEPENDENT_AMBULATORY_CARE_PROVIDER_SITE_OTHER): Payer: Medicaid Other | Admitting: Student

## 2023-06-12 ENCOUNTER — Ambulatory Visit (INDEPENDENT_AMBULATORY_CARE_PROVIDER_SITE_OTHER): Payer: Medicaid Other | Admitting: Licensed Clinical Social Worker

## 2023-06-12 ENCOUNTER — Other Ambulatory Visit: Payer: Medicaid Other

## 2023-06-12 ENCOUNTER — Encounter: Payer: Self-pay | Admitting: *Deleted

## 2023-06-12 VITALS — BP 119/82 | HR 81 | Wt 186.0 lb

## 2023-06-12 DIAGNOSIS — Z3483 Encounter for supervision of other normal pregnancy, third trimester: Secondary | ICD-10-CM

## 2023-06-12 DIAGNOSIS — Z3480 Encounter for supervision of other normal pregnancy, unspecified trimester: Secondary | ICD-10-CM

## 2023-06-12 DIAGNOSIS — Z3A31 31 weeks gestation of pregnancy: Secondary | ICD-10-CM | POA: Diagnosis not present

## 2023-06-12 DIAGNOSIS — O26899 Other specified pregnancy related conditions, unspecified trimester: Secondary | ICD-10-CM | POA: Diagnosis not present

## 2023-06-12 DIAGNOSIS — Z658 Other specified problems related to psychosocial circumstances: Secondary | ICD-10-CM | POA: Diagnosis not present

## 2023-06-12 DIAGNOSIS — Z6791 Unspecified blood type, Rh negative: Secondary | ICD-10-CM

## 2023-06-12 DIAGNOSIS — Z3009 Encounter for other general counseling and advice on contraception: Secondary | ICD-10-CM

## 2023-06-12 DIAGNOSIS — D563 Thalassemia minor: Secondary | ICD-10-CM

## 2023-06-12 DIAGNOSIS — O365990 Maternal care for other known or suspected poor fetal growth, unspecified trimester, not applicable or unspecified: Secondary | ICD-10-CM

## 2023-06-12 DIAGNOSIS — O2303 Infections of kidney in pregnancy, third trimester: Secondary | ICD-10-CM

## 2023-06-12 NOTE — Progress Notes (Signed)
Pt presents for ROB. Does not express any unusual complaints to RN at this time.

## 2023-06-12 NOTE — Progress Notes (Signed)
   PRENATAL VISIT NOTE  Subjective:  Christina Willis is a 27 y.o. [id number] at [redacted]w[redacted]d being seen today for ongoing prenatal care.  She is currently monitored for the following issues for this high-risk pregnancy and has Rh negative, antepartum; Maternal obesity syndrome, antepartum; Alpha thalassemia silent carrier; Unwanted fertility; and IUGR (intrauterine growth restriction) affecting care of mother on their problem list.  Patient reports no complaints.  Contractions: Irritability. Vag. Bleeding: None.  Movement: Present. Denies leaking of fluid.   The following portions of the patient's history were reviewed and updated as appropriate: allergies, current medications, past family history, past medical history, past social history, past surgical history and problem list.   Objective:   Vitals:   06/12/23 1014  BP: 119/82  Pulse: 81  Weight: 186 lb (84.4 kg)    Fetal Status:   Fundal Height: 29 cm Movement: Present     General:  Alert, oriented and cooperative. Patient is in no acute distress.  Skin: Skin is warm and dry. No rash noted.   Cardiovascular: Normal heart rate noted  Respiratory: Normal respiratory effort, no problems with respiration noted  Abdomen: Soft, gravid, appropriate for gestational age.  Pain/Pressure: Present     Pelvic: Cervical exam deferred        Extremities: Normal range of motion.  Edema: None  Mental Status: Normal mood and affect. Normal behavior. Normal judgment and thought content.   Assessment and Plan:  Pregnancy: G5P4004 at [redacted]w[redacted]d 1. Supervision of other normal pregnancy, antepartum - frequent and vigorous fetal movement  2. [redacted] weeks gestation of pregnancy - third trimester labs rescheduled - declined tdap  3. Alpha thalassemia silent carrier   4. Rh negative, antepartum - Rhogam as needed  5. Pyelonephritis affecting pregnancy in third trimester - completed treatment  6. Unwanted fertility - BTL papers signed 08/21  7.  Multigravida in third trimester   8. Fetal growth restriction antepartum - EFW - 7% - follow-up growth scan is scheduled - weekly doppler studies and BPPs   Preterm labor symptoms and general obstetric precautions including but not limited to vaginal bleeding, contractions, leaking of fluid and fetal movement were reviewed in detail with the patient. Please refer to After Visit Summary for other counseling recommendations.   Return in about 2 weeks (around 06/26/2023) for LOB, IN-PERSON.  Future Appointments  Date Time Provider Department Center  06/13/2023 10:15 AM [facility] NURSE [facility]  06/13/2023 10:30 AM [facility] US1 [facility]  06/26/2023  8:30 AM [facility] LAB [facility] None  06/26/2023  9:35 AM Milas Hock, MD [facility] None  07/04/2023 10:15 AM [facility] NURSE [facility]  07/04/2023 10:30 AM [facility] US2 [facility]  07/10/2023 11:15 AM Lennart Pall, MD [facility] None  07/11/2023  9:15 AM [facility] NURSE [facility]  07/11/2023  9:30 AM [facility] US2 [facility]  07/11/2023 12:15 PM [facility] NURSE [facility]  07/11/2023 12:30 PM [facility] US3 [facility]  07/16/2023  9:55 AM Constant, Peggy, MD [facility] None  07/18/2023  9:15 AM [facility] NURSE [facility]  07/18/2023  9:30 AM [facility] US3 [facility]  07/24/2023  9:55 AM Raelyn Mora, CNM [facility] None  07/25/2023 10:15 AM [facility] NURSE [facility]  07/25/2023 10:30 AM [facility] US4 [facility]  07/31/2023  9:55 AM Raelyn Mora, CNM [facility] None    Corlis Hove, NP

## 2023-06-13 ENCOUNTER — Ambulatory Visit: Payer: Medicaid Other

## 2023-06-13 NOTE — BH Specialist Note (Signed)
Integrated Behavioral Health Follow Up In-Person Visit  MRN: [id number] Name: Christina Willis  Number of Integrated Behavioral Health Clinician visits: 2 Session Start time:  9:44am Session End time: 10:15am Total time in minutes: 30 mins in person at [facility]   Types of Service: General Behavioral Integrated Care (BHI)  Interpretor:No. Interpretor Name and Language: none  Subjective: Christina Willis is a 27 y.o. female accompanied by n/a Patient was referred by Dr. Alysia Willis  for stressors. Patient reports the following symptoms/concerns: psychosocial stressors  Duration of problem: approx one year; Severity of problem: mild  Objective: Mood: good and Affect: Appropriate Risk of harm to self or others: No plan to harm self or others  Life Context: Family and Social: lives with family  School/Work: n/a Self-Care: rest  Life Changes: new pregnancy  Patient and/or Family's Strengths/Protective Factors: Concrete supports in place (healthy food, safe environments, etc.)  Goals Addressed: Patient will:  Reduce symptoms of: stress   Increase knowledge and/or ability of: stress reduction   Demonstrate ability to: Increase healthy adjustment to current life circumstances  Progress towards Goals: Ongoing  Interventions: Interventions utilized:  Psychoeducation and/or Health Education Standardized Assessments completed: PHQ 9  Patient and/or Family Response: Christina Willis reports stress and trouble sleeping  Assessment: Patient currently experiencing psychosocial stress.   Patient may benefit from community mental health.  Plan: Follow up with behavioral health clinician on : as needed  Behavioral recommendations: Prioritize rest, reduce stress and engage in self care Referral(s): Integrated [employer] (In Clinic) "From scale of 1-10, how likely are you to follow plan?":    Christina Saxon, LCSW

## 2023-06-19 ENCOUNTER — Ambulatory Visit: Payer: Medicaid Other

## 2023-06-19 ENCOUNTER — Ambulatory Visit: Payer: Medicaid Other | Attending: Obstetrics

## 2023-06-19 DIAGNOSIS — O0933 Supervision of pregnancy with insufficient antenatal care, third trimester: Secondary | ICD-10-CM

## 2023-06-19 DIAGNOSIS — O365990 Maternal care for other known or suspected poor fetal growth, unspecified trimester, not applicable or unspecified: Secondary | ICD-10-CM | POA: Insufficient documentation

## 2023-06-19 DIAGNOSIS — D279 Benign neoplasm of unspecified ovary: Secondary | ICD-10-CM | POA: Diagnosis not present

## 2023-06-19 DIAGNOSIS — Z3A32 32 weeks gestation of pregnancy: Secondary | ICD-10-CM

## 2023-06-19 DIAGNOSIS — O3483 Maternal care for other abnormalities of pelvic organs, third trimester: Secondary | ICD-10-CM

## 2023-06-19 DIAGNOSIS — O99213 Obesity complicating pregnancy, third trimester: Secondary | ICD-10-CM

## 2023-06-19 DIAGNOSIS — O285 Abnormal chromosomal and genetic finding on antenatal screening of mother: Secondary | ICD-10-CM

## 2023-06-19 DIAGNOSIS — D563 Thalassemia minor: Secondary | ICD-10-CM

## 2023-06-19 DIAGNOSIS — O360130 Maternal care for anti-D [Rh] antibodies, third trimester, not applicable or unspecified: Secondary | ICD-10-CM | POA: Diagnosis not present

## 2023-06-19 DIAGNOSIS — E669 Obesity, unspecified: Secondary | ICD-10-CM

## 2023-06-26 ENCOUNTER — Other Ambulatory Visit: Payer: Medicaid Other

## 2023-06-26 ENCOUNTER — Encounter: Payer: Medicaid Other | Admitting: Obstetrics and Gynecology

## 2023-06-27 ENCOUNTER — Encounter: Payer: Self-pay | Admitting: Obstetrics

## 2023-06-27 ENCOUNTER — Encounter (HOSPITAL_COMMUNITY): Payer: Self-pay | Admitting: Family Medicine

## 2023-06-27 ENCOUNTER — Ambulatory Visit: Payer: Medicaid Other | Attending: Obstetrics

## 2023-06-27 ENCOUNTER — Other Ambulatory Visit: Payer: Self-pay | Admitting: Obstetrics

## 2023-06-27 ENCOUNTER — Ambulatory Visit: Payer: Medicaid Other

## 2023-06-27 ENCOUNTER — Inpatient Hospital Stay (HOSPITAL_COMMUNITY)
Admission: AD | Admit: 2023-06-27 | Discharge: 2023-06-27 | Disposition: A | Discharge status: Home / Self Care | Payer: Medicaid Other | Attending: Family Medicine | Admitting: Family Medicine

## 2023-06-27 ENCOUNTER — Ambulatory Visit: Payer: Medicaid Other | Admitting: *Deleted

## 2023-06-27 DIAGNOSIS — O3483 Maternal care for other abnormalities of pelvic organs, third trimester: Secondary | ICD-10-CM

## 2023-06-27 DIAGNOSIS — O0933 Supervision of pregnancy with insufficient antenatal care, third trimester: Secondary | ICD-10-CM

## 2023-06-27 DIAGNOSIS — O365990 Maternal care for other known or suspected poor fetal growth, unspecified trimester, not applicable or unspecified: Secondary | ICD-10-CM | POA: Diagnosis present

## 2023-06-27 DIAGNOSIS — O285 Abnormal chromosomal and genetic finding on antenatal screening of mother: Secondary | ICD-10-CM

## 2023-06-27 DIAGNOSIS — O365930 Maternal care for other known or suspected poor fetal growth, third trimester, not applicable or unspecified: Secondary | ICD-10-CM | POA: Diagnosis not present

## 2023-06-27 DIAGNOSIS — O360130 Maternal care for anti-D [Rh] antibodies, third trimester, not applicable or unspecified: Secondary | ICD-10-CM | POA: Diagnosis not present

## 2023-06-27 DIAGNOSIS — Z3A33 33 weeks gestation of pregnancy: Secondary | ICD-10-CM | POA: Insufficient documentation

## 2023-06-27 DIAGNOSIS — O288 Other abnormal findings on antenatal screening of mother: Secondary | ICD-10-CM | POA: Diagnosis not present

## 2023-06-27 DIAGNOSIS — E669 Obesity, unspecified: Secondary | ICD-10-CM

## 2023-06-27 DIAGNOSIS — O99213 Obesity complicating pregnancy, third trimester: Secondary | ICD-10-CM

## 2023-06-27 DIAGNOSIS — D279 Benign neoplasm of unspecified ovary: Secondary | ICD-10-CM

## 2023-06-27 DIAGNOSIS — D563 Thalassemia minor: Secondary | ICD-10-CM

## 2023-06-27 NOTE — MAU Provider Note (Cosign Needed Addendum)
S: Ms. Christina Willis is a 27 y.o. [id number] at [redacted]w[redacted]d  who presents to MAU today for from MFM for prolonged monitoring due to 6/10 BPP no breathing and nonreactive NST. Pregnancy c/b IUGR 8%. Pt reports good fetal movements. Denies LOF, VB, or contractions. No other complaints.      Fetus very active, kicking monitors off.  Fetal Monitoring: Baseline: 130 Variability: moderate Accelerations: present Decelerations: none Contractions: rare  MDM Discussed patient with RN. NST reviewed.   A: SIUP at [redacted]w[redacted]d  6/10 BPP no breathing and non reactive NST  Palpable frequent fetal movements in MAU Reactive extended monitoring in MAU  P: Discharge home Labor precautions and kick counts included in AVS Patient to follow-up with OB and MFM as scheduled  Patient may return to MAU as needed or when in labor   Wyn Forster, MD 06/27/2023 12:48 PM

## 2023-06-27 NOTE — MAU Note (Signed)
Sent from MFM for prolonged monitoring. 6/10 BPP no breathing and non reactive NST. Pt has IUGR . Pt reports good fetal movement. Denies any pain or cramping.

## 2023-06-27 NOTE — Procedures (Signed)
Christina Willis 1996/06/11 [redacted]w[redacted]d  Fetus A Non-Stress Test Interpretation for 10/04/24NST with BPP  Indication: IUGR and Unsatisfactory BPP  Fetal Heart Rate A Mode: External Baseline Rate (A): 140 bpm Variability: Moderate, Minimal Accelerations: 10 x 10 (1 10x10) Decelerations: None Multiple birth?: No  Uterine Activity Mode: Toco Contraction Frequency (min): one uc during NST Contraction Duration (sec): 100 Contraction Quality: Mild Resting Tone Palpated: Relaxed  Interpretation (Fetal Testing) Nonstress Test Interpretation: Non-reactive Comments: Tracing reviewed by Dr. Parke Poisson

## 2023-06-30 ENCOUNTER — Encounter: Payer: Medicaid Other | Admitting: Obstetrics & Gynecology

## 2023-06-30 ENCOUNTER — Other Ambulatory Visit: Payer: Medicaid Other

## 2023-07-01 ENCOUNTER — Ambulatory Visit: Payer: Medicaid Other | Attending: Obstetrics | Admitting: *Deleted

## 2023-07-01 ENCOUNTER — Ambulatory Visit: Payer: Medicaid Other

## 2023-07-01 VITALS — BP 128/72

## 2023-07-01 DIAGNOSIS — O365930 Maternal care for other known or suspected poor fetal growth, third trimester, not applicable or unspecified: Secondary | ICD-10-CM | POA: Diagnosis not present

## 2023-07-01 DIAGNOSIS — Z3A34 34 weeks gestation of pregnancy: Secondary | ICD-10-CM | POA: Insufficient documentation

## 2023-07-01 DIAGNOSIS — O365990 Maternal care for other known or suspected poor fetal growth, unspecified trimester, not applicable or unspecified: Secondary | ICD-10-CM | POA: Diagnosis present

## 2023-07-01 NOTE — Procedures (Signed)
Christina Willis 06-18-1996 [redacted]w[redacted]d  Fetus A Non-Stress Test Interpretation for 07/01/23-NST only  Indication: IUGR  Fetal Heart Rate A Mode: External Baseline Rate (A): 145 bpm Variability: Moderate Accelerations: 15 x 15 Decelerations: Variable Multiple birth?: No  Uterine Activity Mode: Toco Contraction Frequency (min): none Resting Tone Palpated: Relaxed  Interpretation (Fetal Testing) Nonstress Test Interpretation: Reactive Comments: Extended NST Tracing reviewed  by DR. Parke Poisson

## 2023-07-04 ENCOUNTER — Other Ambulatory Visit: Payer: Medicaid Other

## 2023-07-04 ENCOUNTER — Ambulatory Visit: Payer: Medicaid Other

## 2023-07-10 ENCOUNTER — Encounter: Payer: Medicaid Other | Admitting: Obstetrics and Gynecology

## 2023-07-11 ENCOUNTER — Ambulatory Visit: Payer: Medicaid Other

## 2023-07-11 ENCOUNTER — Other Ambulatory Visit: Payer: Medicaid Other

## 2023-07-16 ENCOUNTER — Encounter: Payer: Medicaid Other | Admitting: Obstetrics and Gynecology

## 2023-07-16 ENCOUNTER — Ambulatory Visit: Payer: Medicaid Other

## 2023-07-16 ENCOUNTER — Ambulatory Visit: Payer: Medicaid Other | Attending: Obstetrics

## 2023-07-18 ENCOUNTER — Ambulatory Visit: Payer: Medicaid Other

## 2023-07-24 ENCOUNTER — Encounter: Payer: Medicaid Other | Admitting: Obstetrics and Gynecology

## 2023-07-25 ENCOUNTER — Ambulatory Visit: Payer: Medicaid Other

## 2023-07-25 ENCOUNTER — Other Ambulatory Visit: Payer: Medicaid Other

## 2023-07-28 ENCOUNTER — Ambulatory Visit: Payer: Medicaid Other

## 2023-07-28 ENCOUNTER — Other Ambulatory Visit: Payer: Medicaid Other

## 2023-07-29 NOTE — Progress Notes (Signed)
  HIGH-RISK PREGNANCY OFFICE VISIT Patient name: Chadwick Yuen MRN [id number]  Date of birth: 23-May-1996 Chief Complaint:   Routine Prenatal Visit  History of Present Illness:   Luby Wintrode is a 27 y.o. [id number] female at [redacted]w[redacted]d with an Estimated Date of Delivery: 08/11/23 being seen today for ongoing management of a high-risk pregnancy complicated by fetal growth restriction 3% Today she reports no complaints. Contractions: Irritability. Vag. Bleeding: None.  Movement: Present. denies leaking of fluid.  Review of Systems:   Pertinent items are noted in HPI Denies abnormal vaginal discharge w/ itching/odor/irritation, headaches, visual changes, shortness of breath, chest pain, abdominal pain, severe nausea/vomiting, or problems with urination or bowel movements unless otherwise stated above. Pertinent History Reviewed:  Reviewed past medical,surgical, social, obstetrical and family history.  Reviewed problem list, medications and allergies. Physical Assessment:   Vitals:   07/31/23 1004  BP: 117/79  Pulse: 78  Weight: 193 lb 12.8 oz (87.9 kg)  Body mass index is 32.25 kg/m.           Physical Examination:   General appearance: alert, well appearing, and in no distress and oriented to person, place, and time  Mental status: alert, oriented to person, place, and time, normal mood, behavior, speech, dress, motor activity, and thought processes  Skin: warm & dry   Extremities: Edema: Trace    Cardiovascular: normal heart rate noted  Respiratory: normal respiratory effort, no distress  Abdomen: gravid, soft, non-tender  Pelvic: Cervical exam deferred         Fetal Status: Fetal Heart Rate (bpm): 133 Fundal Height: 32 cm Movement: Present Presentation: Vertex  Fetal Surveillance Testing today:    No results found for this or any previous visit (from the past 24 hour(s)).  Assessment & Plan:  1) High-risk pregnancy G5P5005 at [redacted]w[redacted]d with an Estimated Date of Delivery: 08/11/23    2) Supervision of high risk pregnancy in third trimester - Discussed labor precautions  3) Poor fetal growth affecting management of mother in third trimester, single or unspecified fetus - Continue MFM recommendations  4) [redacted] weeks gestation of pregnancy   Meds: No orders of the defined types were placed in this encounter.   Labs/procedures today:   Treatment Plan:    Reviewed: Term labor symptoms and general obstetric precautions including but not limited to vaginal bleeding, contractions, leaking of fluid and fetal movement were reviewed in detail with the patient.  All questions were answered. Has home bp cuff. Check bp weekly, let us know if >140/90.   Follow-up: Return in about 1 week (around 08/07/2023) for Return OB visit.  No orders of the defined types were placed in this encounter.  Raelyn Mora MSN, CNM 07/31/2023 10:00 AM

## 2023-07-31 ENCOUNTER — Ambulatory Visit (INDEPENDENT_AMBULATORY_CARE_PROVIDER_SITE_OTHER): Payer: Medicaid Other | Admitting: Obstetrics and Gynecology

## 2023-07-31 VITALS — BP 117/79 | HR 78 | Wt 193.8 lb

## 2023-07-31 DIAGNOSIS — Z3A38 38 weeks gestation of pregnancy: Secondary | ICD-10-CM

## 2023-07-31 DIAGNOSIS — O0993 Supervision of high risk pregnancy, unspecified, third trimester: Secondary | ICD-10-CM | POA: Diagnosis not present

## 2023-07-31 DIAGNOSIS — O365930 Maternal care for other known or suspected poor fetal growth, third trimester, not applicable or unspecified: Secondary | ICD-10-CM

## 2023-07-31 NOTE — Progress Notes (Signed)
193.8lb

## 2023-08-02 ENCOUNTER — Inpatient Hospital Stay (HOSPITAL_COMMUNITY)
Admission: AD | Admit: 2023-08-02 | Discharge: 2023-08-04 | DRG: 807 | Disposition: A | Discharge status: Home / Self Care | Payer: Medicaid Other | Attending: Maternal & Fetal Medicine | Admitting: Maternal & Fetal Medicine

## 2023-08-02 ENCOUNTER — Encounter (HOSPITAL_COMMUNITY): Payer: Self-pay | Admitting: Maternal & Fetal Medicine

## 2023-08-02 DIAGNOSIS — O4292 Full-term premature rupture of membranes, unspecified as to length of time between rupture and onset of labor: Principal | ICD-10-CM | POA: Diagnosis present

## 2023-08-02 DIAGNOSIS — Z30017 Encounter for initial prescription of implantable subdermal contraceptive: Secondary | ICD-10-CM

## 2023-08-02 DIAGNOSIS — O165 Unspecified maternal hypertension, complicating the puerperium: Secondary | ICD-10-CM | POA: Diagnosis present

## 2023-08-02 DIAGNOSIS — Z91018 Allergy to other foods: Secondary | ICD-10-CM

## 2023-08-02 DIAGNOSIS — O99214 Obesity complicating childbirth: Secondary | ICD-10-CM | POA: Diagnosis present

## 2023-08-02 DIAGNOSIS — O26893 Other specified pregnancy related conditions, third trimester: Secondary | ICD-10-CM | POA: Diagnosis present

## 2023-08-02 DIAGNOSIS — Z23 Encounter for immunization: Secondary | ICD-10-CM

## 2023-08-02 DIAGNOSIS — O365930 Maternal care for other known or suspected poor fetal growth, third trimester, not applicable or unspecified: Secondary | ICD-10-CM | POA: Diagnosis present

## 2023-08-02 DIAGNOSIS — O99824 Streptococcus B carrier state complicating childbirth: Secondary | ICD-10-CM | POA: Diagnosis present

## 2023-08-02 DIAGNOSIS — Z148 Genetic carrier of other disease: Secondary | ICD-10-CM

## 2023-08-02 DIAGNOSIS — Z6791 Unspecified blood type, Rh negative: Secondary | ICD-10-CM

## 2023-08-02 DIAGNOSIS — Z3A38 38 weeks gestation of pregnancy: Secondary | ICD-10-CM

## 2023-08-02 DIAGNOSIS — O4290 Premature rupture of membranes, unspecified as to length of time between rupture and onset of labor, unspecified weeks of gestation: Principal | ICD-10-CM | POA: Diagnosis present

## 2023-08-02 LAB — POCT FERN TEST

## 2023-08-02 LAB — RUPTURE OF MEMBRANE (ROM)PLUS: Rom Plus: NEGATIVE

## 2023-08-02 NOTE — H&P (Addendum)
OBSTETRIC ADMISSION HISTORY AND PHYSICAL  Cashe Gerlitz is a 27 y.o. female [phone] with IUP at [redacted]w[redacted]d by LMP presenting for PROM. She reports +FMs, + LOF, no VB, no blurry vision, headaches or peripheral edema, no RUQ pain.  She plans on bottle feeding. She request ppBTL for birth control. She received her prenatal [facility] at [facility]   Dating: By LMP --->  Estimated Date of Delivery: 08/11/23  Sono:   @[redacted]w[redacted]d , CWD, normal anatomy, cephalic presentation, posterior placental lie, 1741g, 3% EFW   Prenatal History/Complications:  - IUGR - Pyelonephritis in 3rd trimester - Rh negative - Unknown GBS status (hx of positive NAAT 2017) - Limited prenatal care - Antibody positive  Past Medical History: Past Medical History:  Diagnosis Date   Alpha thalassemia silent carrier 06/14/2019   Aa/a- [ ]  FOB testing and Genetic counseling recommended   Anemia    Anxiety    Asthma    Depression    GBS (group B Streptococcus carrier), +RV culture, currently pregnant 09/21/2020   Headache    Pyelonephritis affecting pregnancy 05/14/2023   Supervision of other normal pregnancy, antepartum 05/13/2023              NURSING     PROVIDER      Office Location    [facility]    Dating by           [place] [employer]    Traditional    Anatomy U/S           Initiated care at     [place]     English                     LAB RESULTS       Support Person         Genetics    NIPS: LR  AFP:                 NT/IT (FT only)                     Carrier Screen    Horizon:       Rhogam              Past Surgical History: Past Surgical History:  Procedure Laterality Date   NO PAST SURGERIES      Obstetrical History: OB History     Gravida  5   Para  4   Term  4   Preterm      AB      Living  4      SAB      IAB      Ectopic      Multiple  0   Live Births  4           Social History Social History   Socioeconomic History   Marital status: Single    Spouse name: Not on  file   Number of children: 4   Years of education: Not on file   Highest education level: Not on file  Occupational History   Not on file  Tobacco Use   Smoking status: Never   Smokeless tobacco: Never  Vaping Use   Vaping status: Never Used  Substance and Sexual Activity   Alcohol use: No   Drug use: No  Sexual activity: Not Currently    Partners: Male    Birth control/protection: None  Other Topics Concern   Not on file  Social History Narrative   Not on file   Social Determinants of Health   Financial Resource Strain: Not on File (01/16/2022)   Received from [employer], [employer]    Financial Resource Strain: 0  Food Insecurity: Not on File (06/19/2023)   Received from [employer]    Food: 0  Transportation Needs: Not on File (01/16/2022)   Received from [employer], [employer] Needs    Transportation: 0  Physical Activity: Not on File (01/16/2022)   Received from [place], [place]   Physical Activity    Physical Activity: 0  Stress: Not on File (01/16/2022)   Received from [facility], [place]   Stress    Stress: 0  Social Connections: Not on File (06/13/2023)   Received from [employer]   Social Connections    Connectedness: 0    Family History: Family History  Problem Relation Age of Onset   Cancer Neg Hx    Diabetes Neg Hx    Hypertension Neg Hx     Allergies: Allergies  Allergen Reactions   Apple Juice Itching    Raw apples   Banana Itching   Peanut-Containing Drug Products Swelling    Raw peanuts   Black Walnut Flavor Itching    Raw walnuts   Other     Apple    Medications Prior to Admission  Medication Sig Dispense Refill Last Dose   Prenatal Vit-Fe Fumarate-FA (PREPLUS) 27-1 MG TABS Take 1 tablet by mouth daily. 30 tablet 13 08/01/2023     Review of Systems   All systems reviewed and negative except as stated in HPI  Blood pressure 114/66, pulse 69, temperature 98.3 F (36.8 C), resp. rate 16, height 5\' 5"   (1.651 m), weight 89.8 kg, last menstrual period 11/04/2022, SpO2 99%, unknown if currently breastfeeding. General appearance: alert, cooperative, and no distress Lungs: normal WOB Presentation: cephalic Fetal monitoringBaseline: 145 bpm, Variability: Good {> 6 bpm), Accelerations: Reactive, and Decelerations: Early and variable occasional Uterine activityFrequency: Every 6-8 minutes Dilation: 3 Effacement (%): Thick Station: -2 Exam by:: Ginnie Smart RN   Prenatal labs: ABO, Rh: O/Negative/-- (08/21 1610) Antibody: Positive, See Final Results (08/21 1610) Rubella: 7.44 (08/21 1610) RPR: Non Reactive (08/21 1610)  HBsAg: Negative (08/21 1610)  HIV: Non Reactive (08/21 1610)  GBS:   unknown 1 hr Glucola Unknown, normal a1c Genetic screening  low risk Anatomy US nml  Prenatal Transfer Tool  Maternal Diabetes: No Genetic Screening: Normal Maternal Ultrasounds/Referrals: IUGR Fetal Ultrasounds or other Referrals:  None Maternal Substance Abuse:  No Significant Maternal Medications:  None Significant Maternal Lab Results:  Rh negative Number of Prenatal Visits:Less than or equal to 3 verified prenatal visits Other Comments:  None  Results for orders placed or performed during the hospital encounter of 08/02/23 (from the past 24 hour(s))  Rupture of Membrane (ROM) Plus   Collection Time: 08/02/23 10:53 PM  Result Value Ref Range   Rom Plus NEGATIVE   POCT fern test   Collection Time: 08/02/23 10:53 PM  Result Value Ref Range   POCT Fern Test     Positive      Patient Active Problem List   Diagnosis Date Noted   IUGR (intrauterine growth restriction) affecting care of mother 05/19/2023   Unwanted fertility 05/14/2023   Alpha thalassemia silent carrier  06/14/2019   Maternal obesity syndrome, antepartum 05/27/2019   Rh negative, antepartum 04/04/2016    Assessment/Plan:  Ana Schmuhl is a 27 y.o. [id number] at [redacted]w[redacted]d here for PROM  #Labor:started pitocin, monitor  fetal tolerance and consider amio infusion if needed #Pain: Plans on epidural #FWB: Cat 2 #ID:  GBS positive on PCR, will give IV penacillin #MOF: bottle #MOC:BTL #Circ:  NA  #Rh negative with antibodies, give Rhogam if baby is Rh+  Barrett Shell, Medical Student  08/02/2023, 11:56 PM  GME ATTESTATION:  Evaluation and management procedures were performed by the [facility] Resident under my supervision. I was immediately available for direct supervision, assistance and direction throughout this encounter.  I also confirm that I have verified the information documented in the resident's note, and that I have also personally reperformed the pertinent components of the physical exam and all of the medical decision making activities.  I have also made any necessary editorial changes.  Wyn Forster, MD OB Fellow, Faculty Practice [facility], Center for [facility] 08/03/2023 1:37 AM

## 2023-08-02 NOTE — MAU Note (Signed)
.  Christina Willis is a 27 y.o. at [redacted]w[redacted]d here in MAU reporting leaking clear fld since 0300 and spotting since this am. Occ cramping. Reports good FM. No recent sve. No pain currently  Onset of complaint: 0300 Pain score: 0 Vitals:   08/02/23 2211 08/02/23 2213  BP:  124/76  Pulse: 73   Resp: 17   Temp: 98.3 F (36.8 C)   SpO2: 98%      FHT:127 Lab orders placed from triage:  labor eval

## 2023-08-02 NOTE — H&P (Incomplete)
OBSTETRIC ADMISSION HISTORY AND PHYSICAL  Christina Willis is a 27 y.o. female [phone] with IUP at [redacted]w[redacted]d by LMP presenting for PPROM. She reports +FMs, + LOF, no VB, no blurry vision, headaches or peripheral edema, no RUQ pain.  She plans on *** feeding. She request *** for birth control. She received her prenatal care at [facility]   Dating: By LMP --->  Estimated Date of Delivery: 08/11/23  Sono:    @[redacted]w[redacted]d , CWD, normal anatomy, cephalic presentation, posterior lie, 1741g, 3% EFW   Prenatal History/Complications:  -IUGR -Pyelonephritis in 3rd trimester -Rh negative -Unknown GBS status -Limited prenatal care  Past Medical History: Past Medical History:  Diagnosis Date  . Alpha thalassemia silent carrier 06/14/2019   Aa/a- [ ]  FOB testing and Genetic counseling recommended  . Anemia   . Anxiety   . Asthma   . Depression   . GBS (group B Streptococcus carrier), +RV culture, currently pregnant 09/21/2020  . Headache   . Pyelonephritis affecting pregnancy 05/14/2023  . Supervision of other normal pregnancy, antepartum 05/13/2023              NURSING     PROVIDER      Office Location    [facility]    Dating by           [facility] Model    Traditional    Anatomy U/S           Initiated care at     27wks                           Language     English                     LAB RESULTS       Support Person         Genetics    NIPS: LR  AFP:                 NT/IT (FT only)                     Carrier Screen    Horizon:       Rhogam              Past Surgical History: Past Surgical History:  Procedure Laterality Date  . NO PAST SURGERIES      Obstetrical History: OB History     Gravida  5   Para  4   Term  4   Preterm      AB      Living  4      SAB      IAB      Ectopic      Multiple  0   Live Births  4           Social History Social History   Socioeconomic History  . Marital status: Single    Spouse name: Not on file  . Number of children: 4  . Years of  education: Not on file  . Highest education level: Not on file  Occupational History  . Not on file  Tobacco Use  . Smoking status: Never  . Smokeless tobacco: Never  Vaping Use  . Vaping status: Never Used  Substance and Sexual Activity  . Alcohol use: No  . Drug use: No  . Sexual activity: Not Currently    Partners: Male  Birth control/protection: None  Other Topics Concern  . Not on file  Social History Narrative  . Not on file   Social Determinants of Health   Financial Resource Strain: Not on File (01/16/2022)   Received from [facility], [employer]   . Financial Resource Strain: 0  Food Insecurity: Not on File (06/19/2023)   Received from [employer]   . Food: 0  Transportation Needs: Not on File (01/16/2022)   Received from [facility], [employer]   . Transportation: 0  Physical Activity: Not on File (01/16/2022)   Received from [place], [place]   Physical Activity   . Physical Activity: 0  Stress: Not on File (01/16/2022)   Received from [place], [place]   Stress   . Stress: 0  Social Connections: Not on File (06/13/2023)   Received from [employer]   . Connectedness: 0    Family History: Family History  Problem Relation Age of Onset  . Cancer Neg Hx   . Diabetes Neg Hx   . Hypertension Neg Hx     Allergies: Allergies  Allergen Reactions  . Apple Juice Itching    Raw apples  . Banana Itching  . Peanut-Containing Drug Products Swelling    Raw peanuts  . Black Walnut Flavor Itching    Raw walnuts  . Other     Apple    Medications Prior to Admission  Medication Sig Dispense Refill Last Dose  . Prenatal Vit-Fe Fumarate-FA (PREPLUS) 27-1 MG TABS Take 1 tablet by mouth daily. 30 tablet 13 08/01/2023     Review of Systems   All systems reviewed and negative except as stated in HPI  Blood pressure 114/66, pulse 69, temperature 98.3 F (36.8 C), resp. rate 16, height 5\' 5"  (1.651 m), weight 89.8  kg, last menstrual period 11/04/2022, SpO2 99%, unknown if currently breastfeeding. General appearance: {general exam:16600} Lungs: clear to auscultation bilaterally Heart: regular rate and rhythm Abdomen: soft, non-tender; bowel sounds normal Pelvic: *** Extremities: Homans sign is negative, no sign of DVT DTR's *** Presentation: {desc; fetal presentation:14558} Fetal monitoring{findings; monitor fetal heart monitor:31527} Uterine activity{Uterine contractions:31516} Dilation: 3 Effacement (%): Thick Station: -2 Exam by:: Ginnie Smart RN   Prenatal labs: ABO, Rh: O/Negative/-- (08/21 1610) Antibody: Positive, See Final Results (08/21 1610) Rubella: 7.44 (08/21 1610) RPR: Non Reactive (08/21 1610)  HBsAg: Negative (08/21 1610)  HIV: Non Reactive (08/21 1610)  GBS:    1 hr Glucola *** Genetic screening  *** Anatomy US ***  Prenatal Transfer Tool  Maternal Diabetes: {Maternal Diabetes:3043596} Genetic Screening: {Genetic Screening:20205} Maternal Ultrasounds/Referrals: {Maternal Ultrasounds / Referrals:20211} Fetal Ultrasounds or other Referrals:  {Fetal Ultrasounds or Other Referrals:20213} Maternal Substance Abuse:  {Maternal Substance Abuse:20223} Significant Maternal Medications:  {Significant Maternal Meds:20233} Significant Maternal Lab Results:  {Significant Maternal Lab Results:20235} Number of Prenatal Visits:{Prenatal Visits:27860} Other Comments:  {Other Comments:20251}  Results for orders placed or performed during the hospital encounter of 08/02/23 (from the past 24 hour(s))  Rupture of Membrane (ROM) Plus   Collection Time: 08/02/23 10:53 PM  Result Value Ref Range   Rom Plus NEGATIVE   POCT fern test   Collection Time: 08/02/23 10:53 PM  Result Value Ref Range   POCT Fern Test     Positive      Patient Active Problem List   Diagnosis Date Noted  . IUGR (intrauterine growth restriction) affecting care of mother 05/19/2023  . Unwanted fertility  05/14/2023  . Alpha thalassemia silent carrier 06/14/2019  . Maternal obesity syndrome, antepartum 05/27/2019  . Rh negative, antepartum 04/04/2016    Assessment/Plan:  Christina Willis is a 27 y.o. [id number] at [redacted]w[redacted]d here for***  #Labor:*** #Pain: *** #FWB: *** #ID:  *** #MOF: *** #MOC:*** #Circ:  ***  Barrett Shell, Medical Student  08/02/2023, 11:56 PM

## 2023-08-03 ENCOUNTER — Encounter (HOSPITAL_COMMUNITY): Payer: Self-pay | Admitting: Family Medicine

## 2023-08-03 ENCOUNTER — Other Ambulatory Visit: Payer: Self-pay

## 2023-08-03 ENCOUNTER — Inpatient Hospital Stay (HOSPITAL_COMMUNITY): Payer: Medicaid Other | Admitting: Anesthesiology

## 2023-08-03 ENCOUNTER — Encounter (HOSPITAL_COMMUNITY)
Admission: AD | Disposition: A | Discharge status: Home / Self Care | Payer: Self-pay | Source: Home / Self Care | Attending: Obstetrics & Gynecology

## 2023-08-03 DIAGNOSIS — O365930 Maternal care for other known or suspected poor fetal growth, third trimester, not applicable or unspecified: Secondary | ICD-10-CM | POA: Diagnosis present

## 2023-08-03 DIAGNOSIS — O99824 Streptococcus B carrier state complicating childbirth: Secondary | ICD-10-CM | POA: Diagnosis present

## 2023-08-03 DIAGNOSIS — O0933 Supervision of pregnancy with insufficient antenatal care, third trimester: Secondary | ICD-10-CM | POA: Diagnosis not present

## 2023-08-03 DIAGNOSIS — O4292 Full-term premature rupture of membranes, unspecified as to length of time between rupture and onset of labor: Secondary | ICD-10-CM | POA: Diagnosis present

## 2023-08-03 DIAGNOSIS — O165 Unspecified maternal hypertension, complicating the puerperium: Secondary | ICD-10-CM | POA: Diagnosis present

## 2023-08-03 DIAGNOSIS — Z3A38 38 weeks gestation of pregnancy: Secondary | ICD-10-CM | POA: Diagnosis not present

## 2023-08-03 DIAGNOSIS — Z91018 Allergy to other foods: Secondary | ICD-10-CM | POA: Diagnosis not present

## 2023-08-03 DIAGNOSIS — O26893 Other specified pregnancy related conditions, third trimester: Secondary | ICD-10-CM | POA: Diagnosis present

## 2023-08-03 DIAGNOSIS — Z148 Genetic carrier of other disease: Secondary | ICD-10-CM | POA: Diagnosis not present

## 2023-08-03 DIAGNOSIS — O4290 Premature rupture of membranes, unspecified as to length of time between rupture and onset of labor, unspecified weeks of gestation: Principal | ICD-10-CM | POA: Diagnosis present

## 2023-08-03 DIAGNOSIS — O4202 Full-term premature rupture of membranes, onset of labor within 24 hours of rupture: Secondary | ICD-10-CM | POA: Diagnosis not present

## 2023-08-03 DIAGNOSIS — O135 Gestational [pregnancy-induced] hypertension without significant proteinuria, complicating the puerperium: Secondary | ICD-10-CM | POA: Diagnosis not present

## 2023-08-03 DIAGNOSIS — Z6791 Unspecified blood type, Rh negative: Secondary | ICD-10-CM | POA: Diagnosis not present

## 2023-08-03 DIAGNOSIS — Z23 Encounter for immunization: Secondary | ICD-10-CM | POA: Diagnosis not present

## 2023-08-03 DIAGNOSIS — O99214 Obesity complicating childbirth: Secondary | ICD-10-CM | POA: Diagnosis present

## 2023-08-03 DIAGNOSIS — Z30017 Encounter for initial prescription of implantable subdermal contraceptive: Secondary | ICD-10-CM | POA: Diagnosis not present

## 2023-08-03 LAB — CBC
HCT: 37.7 % (ref 36.0–46.0)
Hemoglobin: 11.8 g/dL — ABNORMAL LOW (ref 12.0–15.0)
MCH: 26.9 pg (ref 26.0–34.0)
MCHC: 31.3 g/dL (ref 30.0–36.0)
MCV: 86.1 fL (ref 80.0–100.0)
Platelets: 209 10*3/uL (ref 150–400)
RBC: 4.38 MIL/uL (ref 3.87–5.11)
RDW: 15.8 % — ABNORMAL HIGH (ref 11.5–15.5)
WBC: 5 10*3/uL (ref 4.0–10.5)
nRBC: 0 % (ref 0.0–0.2)

## 2023-08-03 LAB — GROUP B STREP BY PCR: Group B strep by PCR: POSITIVE — AB

## 2023-08-03 LAB — TYPE AND SCREEN
ABO/RH(D): O NEG
Antibody Screen: NEGATIVE

## 2023-08-03 LAB — RPR: RPR Ser Ql: NONREACTIVE

## 2023-08-03 SURGERY — LIGATION, FALLOPIAN TUBE, POSTPARTUM
Anesthesia: Epidural

## 2023-08-03 MED ORDER — SODIUM CHLORIDE 0.9 % IV SOLN: 250.0000 mL | INTRAVENOUS | Status: DC | PRN

## 2023-08-03 MED ORDER — ONDANSETRON HCL 4 MG/2ML IJ SOLN: 4.0000 mg | INTRAMUSCULAR | Status: DC | PRN

## 2023-08-03 MED ORDER — DIPHENHYDRAMINE HCL 50 MG/ML IJ SOLN: 12.5000 mg | INTRAMUSCULAR | Status: DC | PRN

## 2023-08-03 MED ORDER — SIMETHICONE 80 MG PO CHEW: 80.0000 mg | CHEWABLE_TABLET | ORAL | Status: DC | PRN

## 2023-08-03 MED ORDER — LACTATED RINGERS IV SOLN: INTRAVENOUS | Status: DC

## 2023-08-03 MED ORDER — ZOLPIDEM TARTRATE 5 MG PO TABS: 5.0000 mg | ORAL_TABLET | Freq: Every evening | ORAL | Status: DC | PRN

## 2023-08-03 MED ORDER — OXYTOCIN BOLUS FROM INFUSION
333.0000 mL | Freq: Once | INTRAVENOUS | Status: AC
  Administered 2023-08-03: 333 mL via INTRAVENOUS

## 2023-08-03 MED ORDER — ACETAMINOPHEN 325 MG PO TABS
650.0000 mg | ORAL_TABLET | ORAL | Status: DC | PRN
Start: 2023-08-03 — End: 2023-08-03

## 2023-08-03 MED ORDER — COCONUT OIL OIL: 1.0000 | TOPICAL_OIL | Status: DC | PRN

## 2023-08-03 MED ORDER — DIBUCAINE (PERIANAL) 1 % EX OINT: 1.0000 | TOPICAL_OINTMENT | CUTANEOUS | Status: DC | PRN

## 2023-08-03 MED ORDER — LACTATED RINGERS IV SOLN: 500.0000 mL | INTRAVENOUS | Status: DC | PRN

## 2023-08-03 MED ORDER — ACETAMINOPHEN 325 MG PO TABS
650.0000 mg | ORAL_TABLET | ORAL | Status: DC | PRN
  Administered 2023-08-03: 650 mg via ORAL
  Filled 2023-08-03: qty 2

## 2023-08-03 MED ORDER — EPHEDRINE 5 MG/ML INJ: 10.0000 mg | INTRAVENOUS | Status: DC | PRN

## 2023-08-03 MED ORDER — OXYCODONE-ACETAMINOPHEN 5-325 MG PO TABS: 2.0000 | ORAL_TABLET | ORAL | Status: DC | PRN

## 2023-08-03 MED ORDER — OXYCODONE-ACETAMINOPHEN 5-325 MG PO TABS: 1.0000 | ORAL_TABLET | ORAL | Status: DC | PRN

## 2023-08-03 MED ORDER — ONDANSETRON HCL 4 MG/2ML IJ SOLN
4.0000 mg | Freq: Four times a day (QID) | INTRAMUSCULAR | Status: DC | PRN
Start: 2023-08-03 — End: 2023-08-03

## 2023-08-03 MED ORDER — TERBUTALINE SULFATE 1 MG/ML IJ SOLN: 0.2500 mg | Freq: Once | INTRAMUSCULAR | Status: DC | PRN

## 2023-08-03 MED ORDER — NIFEDIPINE ER OSMOTIC RELEASE 30 MG PO TB24
30.0000 mg | ORAL_TABLET | Freq: Every day | ORAL | Status: DC
  Administered 2023-08-03 – 2023-08-04 (×2): 30 mg via ORAL
  Filled 2023-08-03 (×2): qty 1

## 2023-08-03 MED ORDER — SODIUM CHLORIDE 0.9% FLUSH
3.0000 mL | Freq: Two times a day (BID) | INTRAVENOUS | Status: DC
  Administered 2023-08-03 – 2023-08-04 (×2): 3 mL via INTRAVENOUS

## 2023-08-03 MED ORDER — SODIUM CHLORIDE 0.9 % IV SOLN
5.0000 10*6.[IU] | Freq: Once | INTRAVENOUS | Status: AC
  Administered 2023-08-03: 5 10*6.[IU] via INTRAVENOUS
  Filled 2023-08-03: qty 5

## 2023-08-03 MED ORDER — OXYTOCIN-SODIUM CHLORIDE 30-0.9 UT/500ML-% IV SOLN
2.5000 [IU]/h | INTRAVENOUS | Status: DC
  Administered 2023-08-03: 2.5 [IU]/h via INTRAVENOUS
  Filled 2023-08-03: qty 500

## 2023-08-03 MED ORDER — OXYTOCIN-SODIUM CHLORIDE 30-0.9 UT/500ML-% IV SOLN
1.0000 m[IU]/min | INTRAVENOUS | Status: DC
  Administered 2023-08-03: 2 m[IU]/min via INTRAVENOUS

## 2023-08-03 MED ORDER — IBUPROFEN 600 MG PO TABS
600.0000 mg | ORAL_TABLET | Freq: Four times a day (QID) | ORAL | Status: DC
  Administered 2023-08-03 – 2023-08-04 (×5): 600 mg via ORAL
  Filled 2023-08-03 (×5): qty 1

## 2023-08-03 MED ORDER — WITCH HAZEL-GLYCERIN EX PADS: 1.0000 | MEDICATED_PAD | CUTANEOUS | Status: DC | PRN

## 2023-08-03 MED ORDER — PRENATAL MULTIVITAMIN CH
1.0000 | ORAL_TABLET | Freq: Every day | ORAL | Status: DC
  Administered 2023-08-03 – 2023-08-04 (×2): 1 via ORAL
  Filled 2023-08-03 (×2): qty 1

## 2023-08-03 MED ORDER — SODIUM CHLORIDE 0.9% FLUSH: 3.0000 mL | INTRAVENOUS | Status: DC | PRN

## 2023-08-03 MED ORDER — FENTANYL-BUPIVACAINE-NACL 0.5-0.125-0.9 MG/250ML-% EP SOLN
12.0000 mL/h | EPIDURAL | Status: DC | PRN
  Administered 2023-08-03: 12 mL/h via EPIDURAL
  Filled 2023-08-03: qty 250

## 2023-08-03 MED ORDER — SOD CITRATE-CITRIC ACID 500-334 MG/5ML PO SOLN
30.0000 mL | ORAL | Status: DC | PRN
Start: 2023-08-03 — End: 2023-08-03

## 2023-08-03 MED ORDER — PENICILLIN G POT IN DEXTROSE 60000 UNIT/ML IV SOLN
3.0000 10*6.[IU] | INTRAVENOUS | Status: DC
  Administered 2023-08-03: 3 10*6.[IU] via INTRAVENOUS
  Filled 2023-08-03: qty 50

## 2023-08-03 MED ORDER — FUROSEMIDE 20 MG PO TABS
20.0000 mg | ORAL_TABLET | Freq: Every day | ORAL | Status: DC
  Administered 2023-08-03 – 2023-08-04 (×2): 20 mg via ORAL
  Filled 2023-08-03 (×2): qty 1

## 2023-08-03 MED ORDER — LACTATED RINGERS IV SOLN: 500.0000 mL | Freq: Once | INTRAVENOUS | Status: DC

## 2023-08-03 MED ORDER — LIDOCAINE HCL (PF) 1 % IJ SOLN: 30.0000 mL | INTRAMUSCULAR | Status: DC | PRN

## 2023-08-03 MED ORDER — BENZOCAINE-MENTHOL 20-0.5 % EX AERO: 1.0000 | INHALATION_SPRAY | CUTANEOUS | Status: DC | PRN

## 2023-08-03 MED ORDER — RHO D IMMUNE GLOBULIN 1500 UNIT/2ML IJ SOSY
300.0000 ug | PREFILLED_SYRINGE | Freq: Once | INTRAMUSCULAR | Status: AC
  Administered 2023-08-04: 300 ug via INTRAMUSCULAR
  Filled 2023-08-03: qty 2

## 2023-08-03 MED ORDER — DIPHENHYDRAMINE HCL 25 MG PO CAPS
25.0000 mg | ORAL_CAPSULE | Freq: Four times a day (QID) | ORAL | Status: DC | PRN
Start: 2023-08-03 — End: 2023-08-04

## 2023-08-03 MED ORDER — PHENYLEPHRINE 80 MCG/ML (10ML) SYRINGE FOR IV PUSH (FOR BLOOD PRESSURE SUPPORT): 80.0000 ug | PREFILLED_SYRINGE | INTRAVENOUS | Status: DC | PRN

## 2023-08-03 MED ORDER — LIDOCAINE HCL (PF) 1 % IJ SOLN
INTRAMUSCULAR | Status: DC | PRN
  Administered 2023-08-03: 5 mL via EPIDURAL
  Administered 2023-08-03: 4 mL via EPIDURAL

## 2023-08-03 MED ORDER — TETANUS-DIPHTH-ACELL PERTUSSIS 5-2.5-18.5 LF-MCG/0.5 IM SUSY: 0.5000 mL | PREFILLED_SYRINGE | Freq: Once | INTRAMUSCULAR | Status: DC

## 2023-08-03 MED ORDER — PHENYLEPHRINE 80 MCG/ML (10ML) SYRINGE FOR IV PUSH (FOR BLOOD PRESSURE SUPPORT)
80.0000 ug | PREFILLED_SYRINGE | INTRAVENOUS | Status: DC | PRN
  Filled 2023-08-03: qty 10

## 2023-08-03 MED ORDER — ONDANSETRON HCL 4 MG PO TABS: 4.0000 mg | ORAL_TABLET | ORAL | Status: DC | PRN

## 2023-08-03 MED ORDER — SENNOSIDES-DOCUSATE SODIUM 8.6-50 MG PO TABS
2.0000 | ORAL_TABLET | ORAL | Status: DC
Start: 2023-08-03 — End: 2023-08-04
  Administered 2023-08-03 – 2023-08-04 (×2): 2 via ORAL
  Filled 2023-08-03 (×2): qty 2

## 2023-08-03 NOTE — Discharge Summary (Signed)
Postpartum Discharge Summary  Date of Service updated***     Patient Name: Christina Willis DOB: 11-Dec-1995 MRN: [id number]  Date of admission: 08/02/2023 Delivery date:08/03/2023 Delivering provider:   Date of discharge: 08/03/2023  Admitting diagnosis: PROM (premature rupture of membranes) [O42.90] Intrauterine pregnancy: [redacted]w[redacted]d     Secondary diagnosis:  Principal Problem:   PROM (premature rupture of membranes)  Additional problems: ***    Discharge diagnosis: Term Pregnancy Delivered and IUGR, limited PNC, PP BTL                                               Post partum procedures:{Postpartum procedures:23558} Augmentation: AROM and Pitocin Complications: None  Hospital course: Onset of Labor With Vaginal Delivery      27 y.o. yo [id number] at [redacted]w[redacted]d was admitted in Latent Labor after PROM on 08/02/2023. Labor course was uncomplicated.   Membrane Rupture Time/Date: 3:00 AM,08/02/2023  Delivery Method:Vaginal, Spontaneous Operative Delivery:N/A Episiotomy:   Lacerations:    Patient had a postpartum course complicated by ***.  She is ambulating, tolerating a regular diet, passing flatus, and urinating well. Patient is discharged home in stable condition on 08/03/23.  Newborn Data: Birth date:08/03/2023 Birth time:9:20 AM Gender:Female Living status:  Apgars: ,  Weight:   Magnesium Sulfate received: {Mag received:30440022} BMZ received: No Rhophylac:{Rhophylac received:30440032}ordered MMR:N/A T-DaP:{Tdap:23962}ordered  Flu: {GMW:10272} RSV Vaccine received: {RSV:31013} Transfusion:{Transfusion received:30440034}  Immunizations received: Immunization History  Administered Date(s) Administered   Tdap 05/28/2016    Physical exam  Vitals:   08/03/23 0830 08/03/23 0925 08/03/23 0926 08/03/23 0930  BP: 129/77 137/85 137/85 (!) 128/92  Pulse: 66 68 68 71  Resp:      Temp:      TempSrc:      SpO2:      Weight:      Height:       General: {Exam;  general:21111117} Lochia: {Desc; appropriate/inappropriate:30686::"appropriate"} Uterine Fundus: {Desc; firm/soft:30687} Incision: {Exam; incision:21111123} DVT Evaluation: {Exam; dvt:2111122} Labs: Lab Results  Component Value Date   WBC 5.0 08/03/2023   HGB 11.8 (L) 08/03/2023   HCT 37.7 08/03/2023   MCV 86.1 08/03/2023   PLT 209 08/03/2023      Latest Ref Rng & Units 05/14/2023    4:10 PM  CMP  Glucose 70 - 99 mg/dL 81   BUN 6 - 20 mg/dL 8   Creatinine 5.36 - 6.44 mg/dL 0.34   Sodium 742 - 595 mmol/L 134   Potassium 3.5 - 5.2 mmol/L 4.5   Chloride 96 - 106 mmol/L 100   CO2 20 - 29 mmol/L 20   Calcium 8.7 - 10.2 mg/dL 8.2   Total Protein 6.0 - 8.5 g/dL 6.3   Total Bilirubin 0.0 - 1.2 mg/dL 0.2   Alkaline Phos 44 - 121 IU/L 87   AST 0 - 40 IU/L 20   ALT 0 - 32 IU/L 20    Edinburgh Score:    03/11/2022   10:08 PM  Edinburgh Postnatal Depression Scale Screening Tool  I have been able to laugh and see the funny side of things. 0  I have looked forward with enjoyment to things. 1  I have blamed myself unnecessarily when things went wrong. 1  I have been anxious or worried for no good reason. 2  I have felt scared or panicky for no good reason. 0  Things  have been getting on top of me. 0  I have been so unhappy that I have had difficulty sleeping. 0  I have felt sad or miserable. 1  I have been so unhappy that I have been crying. 1  The thought of harming myself has occurred to me. 0  Edinburgh Postnatal Depression Scale Total 6   No data recorded  After visit meds:  Allergies as of 08/03/2023       Reactions   Apple Juice Itching   Raw apples   Banana Itching   Peanut-containing Drug Products Swelling   Raw peanuts   Black Walnut Flavor Itching   Raw walnuts   Other    Apple     Med Rec must be completed prior to using this SMARTLINK***        Discharge home in stable condition Infant Feeding: {Baby feeding:23562} Infant Disposition:{CHL IP OB  HOME WITH ZOXWRU:04540} Discharge instruction: per After Visit Summary and Postpartum booklet. Activity: Advance as tolerated. Pelvic rest for 6 weeks.  Diet: {OB JWJX:91478295} Future Appointments: Future Appointments  Date Time Provider Department Center  08/05/2023  9:15 AM [facility] NURSE [facility]  08/05/2023  9:30 AM [facility] US6 [facility]  08/07/2023  9:35 AM Warden Fillers, MD [facility] None   Follow up Visit: Sent message to [facility] 11/10  Please schedule this patient for a In person postpartum visit in 4 weeks with the following provider: Any provider. Additional Postpartum F/U:Incision check 1 week  High risk pregnancy complicated by:  IUGR, Limited PNC Delivery mode:  Vaginal, Spontaneous Anticipated Birth Control:  Nexplanon   08/03/2023 Hessie Dibble, MD

## 2023-08-03 NOTE — Anesthesia Postprocedure Evaluation (Signed)
Anesthesia Post Note  Patient: Lareen Seldomridge  Procedure(s) Performed: AN AD HOC LABOR EPIDURAL     Patient location during evaluation: Mother Baby Anesthesia Type: Epidural Level of consciousness: awake, awake and alert and oriented Pain management: pain level controlled Vital Signs Assessment: post-procedure vital signs reviewed and stable Respiratory status: spontaneous breathing, nonlabored ventilation and respiratory function stable Cardiovascular status: blood pressure returned to baseline and stable Postop Assessment: no headache, no backache, able to ambulate, adequate PO intake, patient able to bend at knees and no apparent nausea or vomiting Anesthetic complications: no   No notable events documented.  Last Vitals:  Vitals:   08/03/23 1239 08/03/23 1715  BP: (!) 149/95 (!) 142/82  Pulse: (!) 58 77  Resp: 16 16  Temp: 36.7 C 36.6 C  SpO2: 100% 100%    Last Pain:  Vitals:   08/03/23 1715  TempSrc: Oral  PainSc: 5    Pain Goal:                   Adea Geisel

## 2023-08-03 NOTE — Anesthesia Procedure Notes (Signed)
Epidural Patient location during procedure: OB Start time: 08/03/2023 7:30 AM End time: 08/03/2023 7:33 AM  Staffing Anesthesiologist: Beryle Lathe, MD Performed: anesthesiologist   Preanesthetic Checklist Completed: patient identified, IV checked, risks and benefits discussed, monitors and equipment checked, pre-op evaluation and timeout performed  Epidural Patient position: sitting Prep: DuraPrep Patient monitoring: continuous pulse ox and blood pressure Approach: midline Location: L2-L3 Injection technique: LOR saline  Needle:  Needle type: Tuohy  Needle gauge: 17 G Needle length: 9 cm Needle insertion depth: 5 cm Catheter size: 19 Gauge Catheter at skin depth: 10 cm Test dose: negative and Other (1% lidocaine)  Assessment Events: blood not aspirated and no cerebrospinal fluid  Additional Notes Patient identified. Risks including, but not limited to, bleeding, infection, nerve damage, paralysis, inadequate analgesia, blood pressure changes, nausea, vomiting, allergic reaction, postpartum back pain, itching, and headache were discussed. Patient expressed understanding and wished to proceed. Sterile prep and drape, including hand hygiene, mask, and sterile gloves were used. The patient was positioned and the spine was prepped. The skin was anesthetized with lidocaine. No paraesthesia or other complication noted. The patient did not experience any signs of intravascular injection such as tinnitus or metallic taste in mouth, nor signs of intrathecal spread such as rapid motor block. Please see nursing notes for vital signs. The patient tolerated the procedure well.   Leslye Peer, MDReason for block:procedure for pain

## 2023-08-03 NOTE — Anesthesia Preprocedure Evaluation (Signed)
Anesthesia Evaluation  Patient identified by MRN, date of birth, ID band Patient awake    Reviewed: Allergy & Precautions, NPO status , Patient's Chart, lab work & pertinent test results  History of Anesthesia Complications Negative for: history of anesthetic complications  Airway Mallampati: II   Neck ROM: Full    Dental   Pulmonary asthma    Pulmonary exam normal        Cardiovascular negative cardio ROS Normal cardiovascular exam     Neuro/Psych  Headaches PSYCHIATRIC DISORDERS Anxiety Depression       GI/Hepatic negative GI ROS, Neg liver ROS,,,  Endo/Other   Obesity   Renal/GU negative Renal ROS     Musculoskeletal negative musculoskeletal ROS (+)    Abdominal   Peds  Hematology  Alpha thalassemia silent carrier Plt 209k    Anesthesia Other Findings   Reproductive/Obstetrics (+) Pregnancy                             Anesthesia Physical Anesthesia Plan  ASA: 2  Anesthesia Plan: Epidural   Post-op Pain Management: Minimal or no pain anticipated   Induction:   PONV Risk Score and Plan: 2 and Treatment may vary due to age or medical condition  Airway Management Planned: Natural Airway  Additional Equipment: None  Intra-op Plan:   Post-operative Plan:   Informed Consent: I have reviewed the patients History and Physical, chart, labs and discussed the procedure including the risks, benefits and alternatives for the proposed anesthesia with the patient or authorized representative who has indicated his/her understanding and acceptance.       Plan Discussed with: Anesthesiologist  Anesthesia Plan Comments: (Labs reviewed. Platelets acceptable, patient not taking any blood thinning medications. Per RN, FHR tracing reported to be stable enough for sitting procedure. Risks and benefits discussed with patient, including PDPH, backache, epidural hematoma, failed epidural,  blood pressure changes, allergic reaction, and nerve injury. Patient expressed understanding and wished to proceed.)       Anesthesia Quick Evaluation

## 2023-08-04 ENCOUNTER — Other Ambulatory Visit (HOSPITAL_COMMUNITY): Payer: Self-pay

## 2023-08-04 DIAGNOSIS — Z30017 Encounter for initial prescription of implantable subdermal contraceptive: Secondary | ICD-10-CM

## 2023-08-04 LAB — CBC
HCT: 32.8 % — ABNORMAL LOW (ref 36.0–46.0)
Hemoglobin: 10.2 g/dL — ABNORMAL LOW (ref 12.0–15.0)
MCH: 26.4 pg (ref 26.0–34.0)
MCHC: 31.1 g/dL (ref 30.0–36.0)
MCV: 85 fL (ref 80.0–100.0)
Platelets: 202 10*3/uL (ref 150–400)
RBC: 3.86 MIL/uL — ABNORMAL LOW (ref 3.87–5.11)
RDW: 16 % — ABNORMAL HIGH (ref 11.5–15.5)
WBC: 8.1 10*3/uL (ref 4.0–10.5)
nRBC: 0 % (ref 0.0–0.2)

## 2023-08-04 MED ORDER — ACETAMINOPHEN 325 MG PO TABS: 650.0000 mg | ORAL_TABLET | ORAL | Status: AC | PRN

## 2023-08-04 MED ORDER — ETONOGESTREL 68 MG ~~LOC~~ IMPL
68.0000 mg | DRUG_IMPLANT | Freq: Once | SUBCUTANEOUS | Status: AC
  Administered 2023-08-04: 68 mg via SUBCUTANEOUS
  Filled 2023-08-04: qty 1

## 2023-08-04 MED ORDER — IBUPROFEN 600 MG PO TABS
600.0000 mg | ORAL_TABLET | Freq: Four times a day (QID) | ORAL | 0 refills | Status: AC
  Filled 2023-08-04: qty 30, 8d supply, fill #0

## 2023-08-04 MED ORDER — NIFEDIPINE ER 30 MG PO TB24
30.0000 mg | ORAL_TABLET | Freq: Every day | ORAL | 0 refills | Status: AC
  Filled 2023-08-04: qty 30, 30d supply, fill #0

## 2023-08-04 MED ORDER — LIDOCAINE HCL 1 % IJ SOLN
0.0000 mL | Freq: Once | INTRAMUSCULAR | Status: DC | PRN
  Filled 2023-08-04: qty 20

## 2023-08-04 MED ORDER — COCONUT OIL OIL: 1.0000 | TOPICAL_OIL | Status: AC | PRN

## 2023-08-04 MED ORDER — FUROSEMIDE 20 MG PO TABS
20.0000 mg | ORAL_TABLET | Freq: Every day | ORAL | 0 refills | Status: AC
  Filled 2023-08-04: qty 5, 5d supply, fill #0

## 2023-08-04 NOTE — Progress Notes (Signed)
POSTPARTUM PROGRESS NOTE  Subjective: Christina Willis is a 27 y.o. [id number] s/p SVD at [redacted]w[redacted]d.  She reports she is doing well. No acute events overnight. She denies any problems with ambulating, voiding or po intake. Denies nausea or vomiting. She has passed flatus. She has not had bowel movement. Pain is well controlled.  Lochia is Minimal. Denies headache, vision changes, dyspnea, and RUQ pain.  Objective: BP 117/78 (BP Location: Left Arm)   Pulse 92   Temp 98.2 F (36.8 C) (Oral)   Resp 16   Ht 5\' 5"  (1.651 m)   Wt 89.8 kg   LMP 11/04/2022   SpO2 99%   Breastfeeding Unknown   BMI 32.95 kg/m   Physical Exam:  General: alert, cooperative and no distress Chest: CTAB, no respiratory distress Abdomen: soft, non-tender  Uterine Fundus: firm, appropriately tender, below umbilicus Extremities: No calf swelling, tenderness, or edema  Recent Labs    08/03/23 0026 08/04/23 0522  HGB 11.8* 10.2*  HCT 37.7 32.8*    Assessment/Plan: Ivanna Wiemer is a 27 y.o. [id number] s/p SVD at [redacted]w[redacted]d after presenting with PROM.   LOS: 1 day   PPD#1: Doing well, pain well-controlled.  -- Routine postpartum care, lactation support -- Encouraged up OOB -- Contraception: IP Nexplanon -- Feeding: bottle feeding -- Circumcision: N/A  Rh negative: Rhogam ordered. Postpartum HTN: >3 BPs <135/85 since delivery, meets criteria PP HTN.  Asymptomatic.  Continue Procardia 30 and Lasix 20.  Dispo: Plan for discharge tomorrow or later today if baby is also discharged by then.  Myeisha Kruser Sharion Dove, MD Tania Ade PGY-1 OB Faculty Practice 08/04/23 6:46 AM

## 2023-08-04 NOTE — Progress Notes (Signed)
CLINICAL SOCIAL WORK MATERNAL/CHILD NOTE  Patient Details  Name: Christina Willis MRN: [id number] Date of Birth: 08/03/2022  Date:  08/04/2023  Clinical Social Worker Initiating Note:  Celso Sickle, [place] Date/Time: Initiated:  08/04/23/1129     Child's Name:  Christina Willis   Biological Parents:  Mother, Father (Father: Collie Siad)   Need for Interpreter:  None   Reason for Referral:  Other (Comment), Parental Support of Premature Babies < 32 weeks/or Critically Ill babies, Behavioral Health Concerns (Limited Prenatal Care)   Address:  [address] [place] [address]    Phone number:  [phone] (home)     Additional phone number:   Household Members/Support Persons (HM/SP):   Household Member/Support Person 1, Household Member/Support Person 2, Household Member/Support Person 3, Household Member/Support Person 4   HM/SP Name Relationship DOB or Age  HM/SP -1 Chevis Pretty daughter 09/21/20  HM/SP -2 Rhaegan Brandin son 10-06-19  HM/SP -3 Clelia Croft daughter 08-12-16  HM/SP -4 Ardelle Anton son 03-11-22  HM/SP -5        HM/SP -6        HM/SP -7        HM/SP -8          Natural Supports (not living in the home):  Parent, Immediate Family   Professional Supports: None   Employment: Unemployed   Type of Work:     Education:  Some Materials engineer arranged:    Surveyor, quantity Resources:  [employer]   Other Resources:  Sales executive   (MOB reported that [facility] told her to call back after infant was born)   Cultural/Religious Considerations Which May Impact Care:    Strengths:  Ability to meet basic needs  , Pediatrician chosen   Psychotropic Medications:         Pediatrician:    Armed forces operational officer area  Pediatrician List:   [place] Other ([facility] [employer])  [employer]    [facility]      Pediatrician Fax Number:    Risk Factors/Current Problems:  Mental Health  Concerns     Cognitive State:  Able to Concentrate  , Alert  , Linear Thinking  , Goal Oriented     Mood/Affect:  Apprehensive  , Calm  , Constricted     CSW Assessment: CSW met with MOB at bedside to complete psychosocial assessment. CSW introduced self and explained role. MOB presented with constricted affect and lack of eye contact. MOB answered all questions asked. MOB reported that she resides with her children. MOB reported that she does not have items needed to care for infant as she did not anticipate delivering early. CSW informed MOB about [facility] [employer] if any assistance is needed obtaining items for infant. MOB reported that a referral for all items would be helpful, CSW agreed to make referral. MOB reported that she will probably be able to get a car seat before infant is ready for discharge. CSW inquired about MOB's support system, MOB reported that her parents and grandma are supports.   CSW inquired about MOB's mental health history. MOB denied any mental health history. CSW inquired about anxiety and depression noted on MOB's chart. MOB shared that she took medication for anxiety in the past and reported that it comes and goes. MOB reported that she does not have a history of depression. CSW asked if MOB was  interested in therapy resources, MOB reported no and shared that she is familiar with the walk in mental health resource. MOB endorsed having postpartum depression after her first pregnancy. CSW inquired about how MOB was feeling emotionally since giving birth, MOB reported that she was feeling fine. MOB presented calm and did not demonstrate any acute mental health signs/symptoms. CSW assessed for safety, MOB denied SI, HI, and domestic violence.   CSW provided education regarding the baby blues period vs. perinatal mood disorders, discussed treatment and gave resources for mental health follow up if concerns arise.  CSW recommends self-evaluation during  the postpartum time period using the New Mom Checklist from Postpartum Progress and encouraged MOB to contact a medical professional if symptoms are noted at any time.    CSW and MOB discussed infant's NICU admission. CSW informed MOB about the NICU and resources/supports available during admission. MOB reported that she has not been updated yet but plans to go visit infant today. CSW encouraged MOB to notify RN that she is interested in an update from a provider. MOB denied any transportation barriers with visiting infant in the NICU. MOB denied any questions/concerns regarding the NICU.   CSW informed MOB about hospital drug screen policy due to limited prenatal care. MOB explained that she was also seen at [facility] weekly. CSW explained that since there are only 3 prenatal care visits charted it meets the criteria for limited prenatal care. CSW informed MOB that infant's UDS and CDS would be monitored and a CPS report would be made if warranted. MOB verbalized understanding. MOB denied any substance use during pregnancy.   CSW provided review of Sudden Infant Death Syndrome (SIDS) precautions.    CSW asked if any additional resources/supports were needed, MOB reported no needs.   CSW will complete FSN referral for needed items.   CSW identifies no further need for intervention and no barriers to discharge at this time. MOB opted to contact CSW if any needs/concerns arise versus CSW checking in weekly.    CSW Plan/Description:  Sudden Infant Death Syndrome (SIDS) Education, Perinatal Mood and Anxiety Disorder (PMADs) Education, Other Information/Referral to [facility], CSW Will Continue to Monitor Umbilical Cord Tissue Drug Screen Results and Make Report if [facility], [facility] Drug Screen Policy Information, Other Patient/Family Education    Antionette Poles, [place] 08/04/2023, 11:34 AM

## 2023-08-04 NOTE — Procedures (Signed)
Post-Placental Nexplanon Insertion Procedure Note  Patient was identified. Informed consent was signed, signed copy in chart. A time-out was performed.    The insertion site was identified 8-10 cm (3-4 inches) from the medial epicondyle of the humerus and 3-5 cm (1.25-2 inches) posterior to (below) the sulcus (groove) between the biceps and triceps muscles of the patient's left arm and marked. The site was prepped and draped in the usual sterile fashion. Pt was prepped with alcohol swab and then injected with 2.5 cc of 1% lidocaine. The site was prepped with betadine. Nexplanon removed form packaging,  Device confirmed in needle, then inserted full length of needle and withdrawn per handbook instructions. Provider and patient verified presence of the implant in the woman's arm by palpation. Pt insertion site was covered with steristrips/adhesive bandage and pressure bandage. There was minimal blood loss. Patient tolerated procedure well.  Patient was given post procedure instructions and Nexplanon user card with expiration date. Condoms were recommended for STI prevention. Patient was asked to keep the pressure dressing on for 24 hours to minimize bruising and keep the adhesive bandage on for 3-5 days. The patient verbalized understanding of the plan of care and agrees.   Lot # [id number] Expiration Date 05/2025

## 2023-08-05 ENCOUNTER — Ambulatory Visit: Payer: Medicaid Other

## 2023-08-05 LAB — RH IG WORKUP (INCLUDES ABO/RH)
Fetal Screen: NEGATIVE
Gestational Age(Wks): 38.6
Unit division: 0

## 2023-08-07 ENCOUNTER — Encounter: Payer: Medicaid Other | Admitting: Obstetrics and Gynecology

## 2023-08-16 ENCOUNTER — Telehealth (HOSPITAL_COMMUNITY): Payer: Self-pay

## 2023-08-16 NOTE — Telephone Encounter (Signed)
08/16/2023 [id number]  Name: Christina Willis MRN: [id number] DOB: June 30, 1996  Reason for Call:  Transition of Care Hospital Discharge Call  Contact Status: Patient Contact Status: Message  Language assistant needed:          Follow-Up Questions:    Inocente Salles Postnatal Depression Scale:  In the Past 7 Days:    PHQ2-9 Depression Scale:     Discharge Follow-up:    Post-discharge interventions: NA  Signature  Signe Colt

## 2023-09-08 ENCOUNTER — Ambulatory Visit: Payer: Medicaid Other | Admitting: Obstetrics

## 2023-09-30 ENCOUNTER — Ambulatory Visit: Payer: Medicaid Other | Admitting: Obstetrics

## 2023-10-21 ENCOUNTER — Ambulatory Visit: Payer: Medicaid Other | Admitting: Obstetrics
